# Patient Record
Sex: Female | Born: 1975 | Race: Black or African American | Hispanic: No | Marital: Single
Health system: Midwestern US, Community
[De-identification: ages and names within clinical notes are randomized; demographics above are authoritative.]

## PROBLEM LIST (undated history)

## (undated) DIAGNOSIS — D649 Anemia, unspecified: Secondary | ICD-10-CM

## (undated) DIAGNOSIS — I10 Essential (primary) hypertension: Secondary | ICD-10-CM

## (undated) DIAGNOSIS — E669 Obesity, unspecified: Secondary | ICD-10-CM

## (undated) DIAGNOSIS — R011 Cardiac murmur, unspecified: Secondary | ICD-10-CM

## (undated) DIAGNOSIS — M5440 Lumbago with sciatica, unspecified side: Secondary | ICD-10-CM

## (undated) DIAGNOSIS — Z9889 Other specified postprocedural states: Secondary | ICD-10-CM

## (undated) DIAGNOSIS — G8929 Other chronic pain: Secondary | ICD-10-CM

## (undated) DIAGNOSIS — M5441 Lumbago with sciatica, right side: Secondary | ICD-10-CM

## (undated) DIAGNOSIS — M5117 Intervertebral disc disorders with radiculopathy, lumbosacral region: Secondary | ICD-10-CM

## (undated) HISTORY — DX: Cardiac murmur, unspecified: R01.1

## (undated) HISTORY — DX: Obesity, unspecified: E66.9

---

## 1997-07-21 ENCOUNTER — Emergency Department (HOSPITAL_COMMUNITY): Admission: EM | Admit: 1997-07-21 | Discharge: 1997-07-21 | Payer: Self-pay | Admitting: Emergency Medicine

## 1997-11-23 ENCOUNTER — Emergency Department (HOSPITAL_COMMUNITY): Admission: EM | Admit: 1997-11-23 | Discharge: 1997-11-23 | Payer: Self-pay | Admitting: Emergency Medicine

## 1999-11-19 ENCOUNTER — Emergency Department (HOSPITAL_COMMUNITY): Admission: EM | Admit: 1999-11-19 | Discharge: 1999-11-19 | Payer: Self-pay | Admitting: Internal Medicine

## 2000-09-12 ENCOUNTER — Emergency Department (HOSPITAL_COMMUNITY): Admission: EM | Admit: 2000-09-12 | Discharge: 2000-09-13 | Payer: Self-pay

## 2000-09-25 ENCOUNTER — Other Ambulatory Visit: Admission: RE | Admit: 2000-09-25 | Discharge: 2000-09-25 | Payer: Self-pay | Admitting: Obstetrics

## 2000-09-25 ENCOUNTER — Encounter: Admission: RE | Admit: 2000-09-25 | Discharge: 2000-09-25 | Payer: Self-pay | Admitting: Obstetrics & Gynecology

## 2001-01-04 ENCOUNTER — Inpatient Hospital Stay (HOSPITAL_COMMUNITY): Admission: AD | Admit: 2001-01-04 | Discharge: 2001-01-04 | Payer: Self-pay | Admitting: Obstetrics & Gynecology

## 2001-01-06 ENCOUNTER — Inpatient Hospital Stay (HOSPITAL_COMMUNITY): Admission: AD | Admit: 2001-01-06 | Discharge: 2001-01-06 | Payer: Self-pay | Admitting: Obstetrics & Gynecology

## 2001-01-07 ENCOUNTER — Encounter: Payer: Self-pay | Admitting: Obstetrics & Gynecology

## 2001-01-13 ENCOUNTER — Inpatient Hospital Stay (HOSPITAL_COMMUNITY): Admission: AD | Admit: 2001-01-13 | Discharge: 2001-01-13 | Payer: Self-pay | Admitting: *Deleted

## 2001-02-09 ENCOUNTER — Inpatient Hospital Stay (HOSPITAL_COMMUNITY): Admission: AD | Admit: 2001-02-09 | Discharge: 2001-02-09 | Payer: Self-pay | Admitting: *Deleted

## 2001-03-01 ENCOUNTER — Inpatient Hospital Stay (HOSPITAL_COMMUNITY): Admission: AD | Admit: 2001-03-01 | Discharge: 2001-03-01 | Payer: Self-pay | Admitting: Obstetrics & Gynecology

## 2001-03-24 ENCOUNTER — Inpatient Hospital Stay (HOSPITAL_COMMUNITY): Admission: AD | Admit: 2001-03-24 | Discharge: 2001-03-24 | Payer: Self-pay | Admitting: Obstetrics

## 2001-05-15 ENCOUNTER — Inpatient Hospital Stay (HOSPITAL_COMMUNITY): Admission: AD | Admit: 2001-05-15 | Discharge: 2001-05-15 | Payer: Self-pay | Admitting: Obstetrics

## 2001-05-15 ENCOUNTER — Encounter: Payer: Self-pay | Admitting: Obstetrics

## 2001-06-22 ENCOUNTER — Inpatient Hospital Stay: Admission: AD | Admit: 2001-06-22 | Discharge: 2001-06-22 | Payer: Self-pay | Admitting: Obstetrics

## 2001-07-09 ENCOUNTER — Encounter: Payer: Self-pay | Admitting: Obstetrics

## 2001-07-09 ENCOUNTER — Ambulatory Visit (HOSPITAL_COMMUNITY): Admission: RE | Admit: 2001-07-09 | Discharge: 2001-07-09 | Payer: Self-pay | Admitting: Obstetrics

## 2001-08-13 ENCOUNTER — Inpatient Hospital Stay (HOSPITAL_COMMUNITY): Admission: AD | Admit: 2001-08-13 | Discharge: 2001-08-13 | Payer: Self-pay | Admitting: Obstetrics

## 2001-08-15 ENCOUNTER — Inpatient Hospital Stay (HOSPITAL_COMMUNITY): Admission: AD | Admit: 2001-08-15 | Discharge: 2001-08-19 | Payer: Self-pay | Admitting: Obstetrics

## 2001-08-16 ENCOUNTER — Encounter: Payer: Self-pay | Admitting: Obstetrics

## 2001-08-23 ENCOUNTER — Inpatient Hospital Stay (HOSPITAL_COMMUNITY): Admission: AD | Admit: 2001-08-23 | Discharge: 2001-08-23 | Payer: Self-pay | Admitting: Obstetrics

## 2001-08-24 ENCOUNTER — Inpatient Hospital Stay (HOSPITAL_COMMUNITY): Admission: AD | Admit: 2001-08-24 | Discharge: 2001-08-26 | Payer: Self-pay | Admitting: Obstetrics

## 2002-05-12 ENCOUNTER — Emergency Department (HOSPITAL_COMMUNITY): Admission: EM | Admit: 2002-05-12 | Discharge: 2002-05-12 | Payer: Self-pay | Admitting: Emergency Medicine

## 2003-04-15 ENCOUNTER — Emergency Department (HOSPITAL_COMMUNITY): Admission: EM | Admit: 2003-04-15 | Discharge: 2003-04-15 | Payer: Self-pay | Admitting: Emergency Medicine

## 2003-06-30 ENCOUNTER — Emergency Department (HOSPITAL_COMMUNITY): Admission: EM | Admit: 2003-06-30 | Discharge: 2003-06-30 | Payer: Self-pay | Admitting: Family Medicine

## 2003-07-02 ENCOUNTER — Emergency Department (HOSPITAL_COMMUNITY): Admission: EM | Admit: 2003-07-02 | Discharge: 2003-07-02 | Payer: Self-pay | Admitting: Family Medicine

## 2003-08-06 ENCOUNTER — Inpatient Hospital Stay (HOSPITAL_COMMUNITY): Admission: AD | Admit: 2003-08-06 | Discharge: 2003-08-07 | Payer: Self-pay | Admitting: Obstetrics & Gynecology

## 2003-09-04 ENCOUNTER — Inpatient Hospital Stay (HOSPITAL_COMMUNITY): Admission: AD | Admit: 2003-09-04 | Discharge: 2003-09-04 | Payer: Self-pay | Admitting: *Deleted

## 2003-10-05 ENCOUNTER — Inpatient Hospital Stay (HOSPITAL_COMMUNITY): Admission: AD | Admit: 2003-10-05 | Discharge: 2003-10-05 | Payer: Self-pay | Admitting: Obstetrics and Gynecology

## 2003-11-25 ENCOUNTER — Inpatient Hospital Stay (HOSPITAL_COMMUNITY): Admission: AD | Admit: 2003-11-25 | Discharge: 2003-11-25 | Payer: Self-pay | Admitting: Obstetrics and Gynecology

## 2003-11-27 ENCOUNTER — Inpatient Hospital Stay (HOSPITAL_COMMUNITY): Admission: AD | Admit: 2003-11-27 | Discharge: 2003-11-27 | Payer: Self-pay | Admitting: *Deleted

## 2004-01-01 ENCOUNTER — Inpatient Hospital Stay (HOSPITAL_COMMUNITY): Admission: AD | Admit: 2004-01-01 | Discharge: 2004-01-01 | Payer: Self-pay | Admitting: Obstetrics

## 2004-02-19 ENCOUNTER — Inpatient Hospital Stay (HOSPITAL_COMMUNITY): Admission: AD | Admit: 2004-02-19 | Discharge: 2004-02-19 | Payer: Self-pay | Admitting: Obstetrics

## 2004-04-25 ENCOUNTER — Inpatient Hospital Stay (HOSPITAL_COMMUNITY): Admission: AD | Admit: 2004-04-25 | Discharge: 2004-04-25 | Payer: Self-pay | Admitting: Obstetrics

## 2004-06-29 ENCOUNTER — Inpatient Hospital Stay (HOSPITAL_COMMUNITY): Admission: AD | Admit: 2004-06-29 | Discharge: 2004-06-29 | Payer: Self-pay | Admitting: Obstetrics

## 2004-07-04 ENCOUNTER — Observation Stay (HOSPITAL_COMMUNITY): Admission: AD | Admit: 2004-07-04 | Discharge: 2004-07-05 | Payer: Self-pay | Admitting: Obstetrics

## 2004-07-06 ENCOUNTER — Inpatient Hospital Stay (HOSPITAL_COMMUNITY): Admission: AD | Admit: 2004-07-06 | Discharge: 2004-07-06 | Payer: Self-pay | Admitting: Obstetrics

## 2004-07-20 ENCOUNTER — Inpatient Hospital Stay (HOSPITAL_COMMUNITY): Admission: AD | Admit: 2004-07-20 | Discharge: 2004-07-22 | Payer: Self-pay | Admitting: Obstetrics

## 2008-06-30 ENCOUNTER — Emergency Department (HOSPITAL_COMMUNITY): Admission: EM | Admit: 2008-06-30 | Discharge: 2008-06-30 | Payer: Self-pay | Admitting: Family Medicine

## 2008-07-29 ENCOUNTER — Inpatient Hospital Stay (HOSPITAL_COMMUNITY): Admission: AD | Admit: 2008-07-29 | Discharge: 2008-07-30 | Payer: Self-pay | Admitting: Obstetrics & Gynecology

## 2008-09-09 ENCOUNTER — Inpatient Hospital Stay (HOSPITAL_COMMUNITY): Admission: AD | Admit: 2008-09-09 | Discharge: 2008-09-09 | Payer: Self-pay | Admitting: Obstetrics & Gynecology

## 2008-10-01 ENCOUNTER — Inpatient Hospital Stay (HOSPITAL_COMMUNITY): Admission: AD | Admit: 2008-10-01 | Discharge: 2008-10-01 | Payer: Self-pay | Admitting: Obstetrics & Gynecology

## 2008-12-31 ENCOUNTER — Inpatient Hospital Stay (HOSPITAL_COMMUNITY): Admission: AD | Admit: 2008-12-31 | Discharge: 2008-12-31 | Payer: Self-pay | Admitting: Obstetrics and Gynecology

## 2008-12-31 ENCOUNTER — Ambulatory Visit: Payer: Self-pay | Admitting: Obstetrics and Gynecology

## 2009-01-20 ENCOUNTER — Inpatient Hospital Stay (HOSPITAL_COMMUNITY): Admission: AD | Admit: 2009-01-20 | Discharge: 2009-01-20 | Payer: Self-pay | Admitting: Obstetrics

## 2009-02-09 ENCOUNTER — Inpatient Hospital Stay (HOSPITAL_COMMUNITY): Admission: AD | Admit: 2009-02-09 | Discharge: 2009-02-10 | Payer: Self-pay | Admitting: Obstetrics & Gynecology

## 2009-03-01 ENCOUNTER — Inpatient Hospital Stay (HOSPITAL_COMMUNITY): Admission: RE | Admit: 2009-03-01 | Discharge: 2009-03-03 | Payer: Self-pay | Admitting: Obstetrics and Gynecology

## 2009-03-02 ENCOUNTER — Encounter (INDEPENDENT_AMBULATORY_CARE_PROVIDER_SITE_OTHER): Payer: Self-pay | Admitting: Obstetrics and Gynecology

## 2010-04-24 LAB — CBC
HCT: 31.7 % — ABNORMAL LOW (ref 36.0–46.0)
HCT: 38.2 % (ref 36.0–46.0)
Hemoglobin: 12.1 g/dL (ref 12.0–15.0)
Hemoglobin: 12.8 g/dL (ref 12.0–15.0)
MCHC: 33.4 g/dL (ref 30.0–36.0)
MCHC: 33.4 g/dL (ref 30.0–36.0)
MCV: 94.5 fL (ref 78.0–100.0)
MCV: 94.3 fL (ref 78.0–100.0)
Platelets: 165 10*3/uL (ref 150–400)
RBC: 3.87 MIL/uL (ref 3.87–5.11)
RDW: 14.2 % (ref 11.5–15.5)
WBC: 11.6 10*3/uL — ABNORMAL HIGH (ref 4.0–10.5)
WBC: 14.1 10*3/uL — ABNORMAL HIGH (ref 4.0–10.5)

## 2010-04-24 LAB — COMPREHENSIVE METABOLIC PANEL
ALT: 36 U/L — ABNORMAL HIGH (ref 0–35)
ALT: 35 U/L (ref 0–35)
Albumin: 2.7 g/dL — ABNORMAL LOW (ref 3.5–5.2)
Albumin: 2.1 g/dL — ABNORMAL LOW (ref 3.5–5.2)
Albumin: 2.7 g/dL — ABNORMAL LOW (ref 3.5–5.2)
Alkaline Phosphatase: 129 U/L — ABNORMAL HIGH (ref 39–117)
Alkaline Phosphatase: 174 U/L — ABNORMAL HIGH (ref 39–117)
BUN: 1 mg/dL — ABNORMAL LOW (ref 6–23)
BUN: 2 mg/dL — ABNORMAL LOW (ref 6–23)
BUN: <1 mg/dL — ABNORMAL LOW (ref 6–23)
CO2: 25 mEq/L (ref 19–32)
CO2: 26 mEq/L (ref 19–32)
CO2: 28 mEq/L (ref 19–32)
Calcium: 8.6 mg/dL (ref 8.4–10.5)
Chloride: 106 mEq/L (ref 96–112)
Chloride: 107 mEq/L (ref 96–112)
Chloride: 108 mEq/L (ref 96–112)
Creatinine, Ser: 0.82 mg/dL (ref 0.4–1.2)
Creatinine, Ser: 0.87 mg/dL (ref 0.4–1.2)
GFR calc non Af Amer: >60 mL/min (ref >60)
Glucose, Bld: 81 mg/dL (ref 70–99)
Glucose, Bld: 107 mg/dL — ABNORMAL HIGH (ref 70–99)
Glucose, Bld: 74 mg/dL (ref 70–99)
Potassium: 3 mEq/L — ABNORMAL LOW (ref 3.5–5.1)
Potassium: 3.2 mEq/L — ABNORMAL LOW (ref 3.5–5.1)
Sodium: 138 mEq/L (ref 135–145)
Sodium: 140 mEq/L (ref 135–145)
Total Bilirubin: 0.8 mg/dL (ref 0.3–1.2)

## 2010-04-24 LAB — RPR
RPR Ser Ql: NONREACTIVE
RPR Ser Ql: NONREACTIVE

## 2010-04-24 LAB — URIC ACID: Uric Acid, Serum: 6 mg/dL (ref 2.4–7.0)

## 2010-04-24 LAB — LACTATE DEHYDROGENASE
LDH: 198 U/L (ref 94–250)
LDH: 206 U/L (ref 94–250)
LDH: 237 U/L (ref 94–250)

## 2010-04-24 LAB — MAGNESIUM: Magnesium: 3.9 mg/dL — ABNORMAL HIGH (ref 1.5–2.5)

## 2010-04-24 LAB — URINALYSIS, ROUTINE W REFLEX MICROSCOPIC
Bilirubin Urine: NEGATIVE
Urobilinogen, UA: 0.2 mg/dL (ref 0.0–1.0)

## 2010-05-10 LAB — COMPREHENSIVE METABOLIC PANEL
AST: 49 U/L — ABNORMAL HIGH (ref 0–37)
CO2: 26 mEq/L (ref 19–32)
Calcium: 8.7 mg/dL (ref 8.4–10.5)
Chloride: 106 mEq/L (ref 96–112)
Creatinine, Ser: 0.61 mg/dL (ref 0.4–1.2)
GFR calc Af Amer: >60 mL/min (ref >60)
GFR calc non Af Amer: >60 mL/min (ref >60)
Glucose, Bld: 102 mg/dL — ABNORMAL HIGH (ref 70–99)
Total Bilirubin: 0.3 mg/dL (ref 0.3–1.2)

## 2010-05-10 LAB — URINALYSIS, DIPSTICK ONLY
Bilirubin Urine: NEGATIVE
Ketones, ur: NEGATIVE mg/dL
Nitrite: NEGATIVE
Specific Gravity, Urine: 1.01 (ref 1.005–1.030)
Urobilinogen, UA: 1 mg/dL (ref 0.0–1.0)
pH: 7.5 (ref 5.0–8.0)

## 2010-05-10 LAB — CBC
HCT: 37.3 % (ref 36.0–46.0)
Hemoglobin: 12.4 g/dL (ref 12.0–15.0)
MCHC: 33.3 g/dL (ref 30.0–36.0)
MCV: 95.1 fL (ref 78.0–100.0)
RBC: 3.93 MIL/uL (ref 3.87–5.11)
WBC: 11.1 10*3/uL — ABNORMAL HIGH (ref 4.0–10.5)

## 2010-05-10 LAB — LACTATE DEHYDROGENASE: LDH: 197 U/L (ref 94–250)

## 2010-05-11 LAB — URINALYSIS, ROUTINE W REFLEX MICROSCOPIC
Bilirubin Urine: NEGATIVE
Hgb urine dipstick: NEGATIVE
Nitrite: NEGATIVE
Protein, ur: NEGATIVE mg/dL
Urobilinogen, UA: 0.2 mg/dL (ref 0.0–1.0)

## 2010-05-14 LAB — COMPREHENSIVE METABOLIC PANEL
ALT: 26 U/L (ref 0–35)
AST: 27 U/L (ref 0–37)
Albumin: 3 g/dL — ABNORMAL LOW (ref 3.5–5.2)
Alkaline Phosphatase: 54 U/L (ref 39–117)
BUN: 3 mg/dL — ABNORMAL LOW (ref 6–23)
CO2: 24 mEq/L (ref 19–32)
Calcium: 8.6 mg/dL (ref 8.4–10.5)
Chloride: 105 mEq/L (ref 96–112)
Creatinine, Ser: 0.6 mg/dL (ref 0.4–1.2)
GFR calc Af Amer: >60 mL/min (ref >60)
GFR calc non Af Amer: >60 mL/min (ref >60)
Glucose, Bld: 109 mg/dL — ABNORMAL HIGH (ref 70–99)
Potassium: 3.6 mEq/L (ref 3.5–5.1)
Sodium: 136 mEq/L (ref 135–145)
Total Bilirubin: 0.4 mg/dL (ref 0.3–1.2)
Total Protein: 6.8 g/dL (ref 6.0–8.3)

## 2010-05-14 LAB — AMYLASE: Amylase: 62 U/L (ref 27–131)

## 2010-05-14 LAB — LIPASE, BLOOD: Lipase: 12 U/L (ref 11–59)

## 2010-05-16 LAB — POCT PREGNANCY, URINE: Preg Test, Ur: POSITIVE

## 2010-05-16 LAB — URINALYSIS, ROUTINE W REFLEX MICROSCOPIC
Hgb urine dipstick: NEGATIVE
Nitrite: NEGATIVE
Protein, ur: NEGATIVE mg/dL
Urobilinogen, UA: 1 mg/dL (ref 0.0–1.0)

## 2010-05-16 LAB — HCG, QUANTITATIVE, PREGNANCY: hCG, Beta Chain, Quant, S: 148425 m[IU]/mL — ABNORMAL HIGH (ref <5)

## 2010-06-24 NOTE — Discharge Summary (Signed)
   NAME:  Sara Erickson, Sara Erickson                        ACCOUNT NO.:  [id number]   MEDICAL RECORD NO.:  [id number]                   PATIENT TYPE:  INP   LOCATION:  9145                                 FACILITY:  [facility]   PHYSICIAN:  Kathreen Cosier, M.D.           DATE OF BIRTH:  1975-02-11   DATE OF ADMISSION:  08/15/2001  DATE OF DISCHARGE:  08/19/2001                                 DISCHARGE SUMMARY   HISTORY OF PRESENT ILLNESS:  The patient is a 35 year old primigravida, EDC  September 09, 2001 [redacted] weeks pregnant who came in with mild contractions and  blood pressure between 130-160/70-80, asymptomatic.  PIH laboratories were  all normal.  The fetus was reactive.  Blood pressures came down to normal.  A 24-hour urine protein was normal and she was discharged home on August 19, 2001 to see me in one week.                                               Kathreen Cosier, M.D.    BAM/MEDQ  D:  09/25/2001  T:  09/25/2001  Job:  [id number]

## 2010-12-29 IMAGING — CR DG KNEE COMPLETE 4+V*R*
4 series · 4 of 4 positions shown · non-contrast
Comparison: None

CLINICAL DATA: Patellar pain after injury

RIGHT KNEE - COMPLETE 4+ VIEW

[view not recorded (1 of 4)]
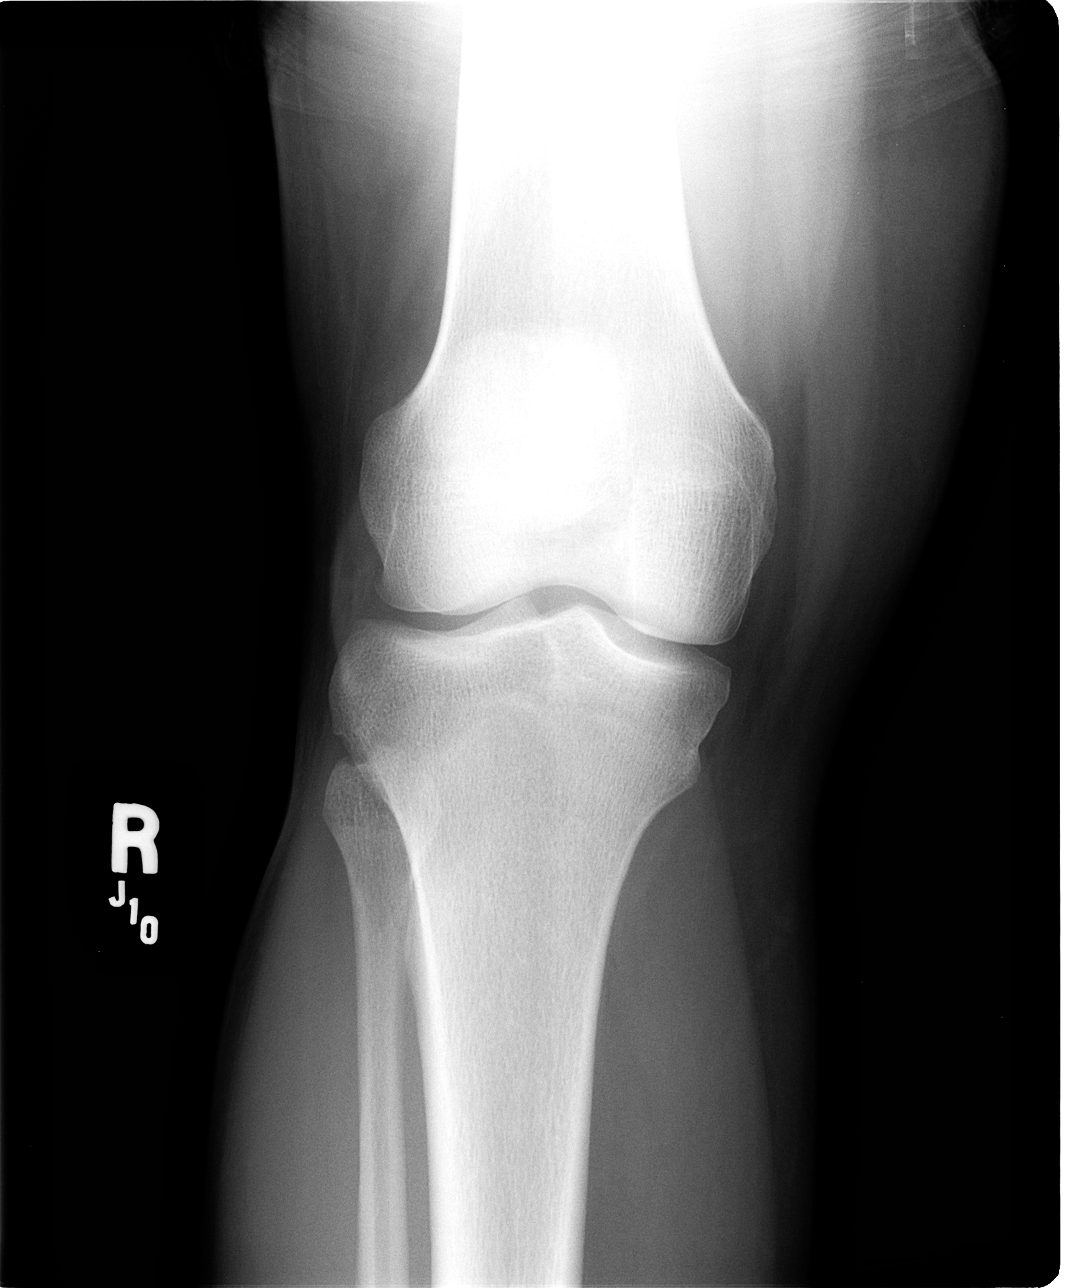

[view not recorded (2 of 4)]
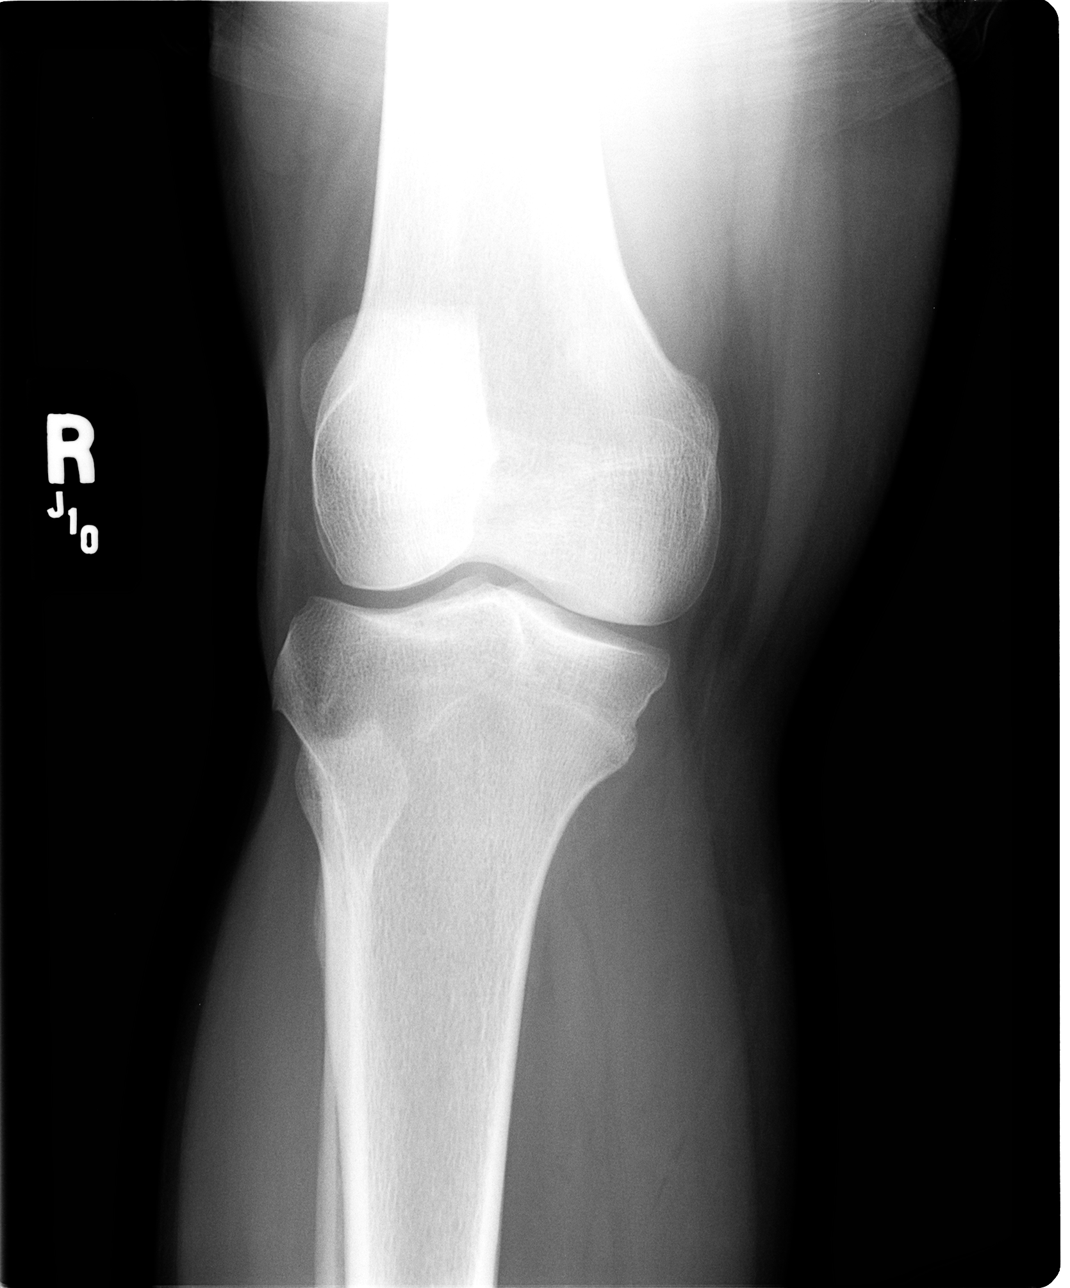

[view not recorded (3 of 4)]
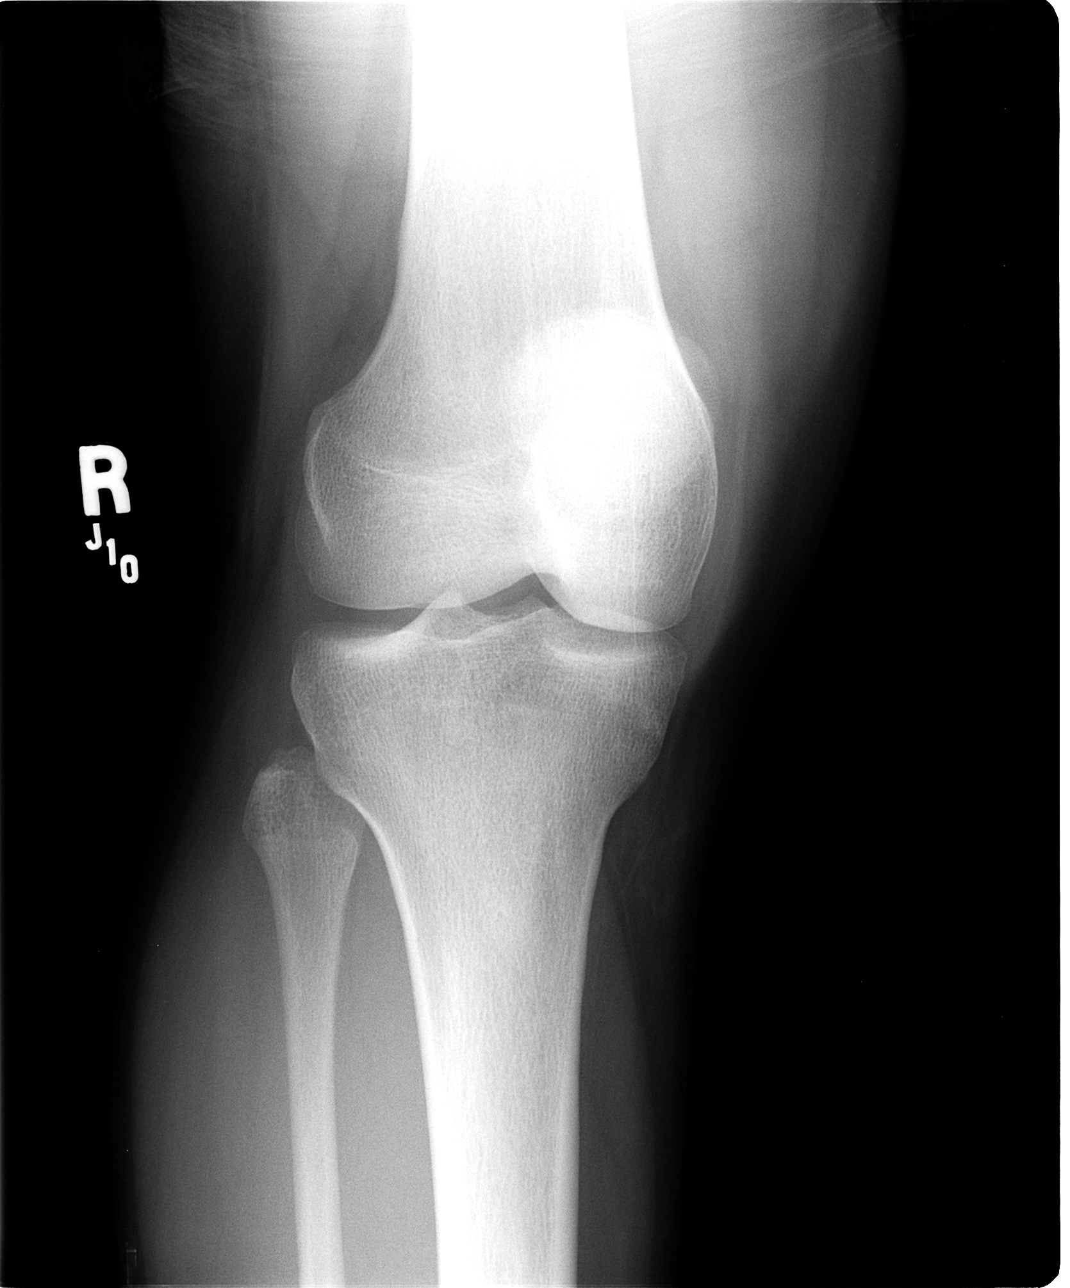

[view not recorded (4 of 4)]
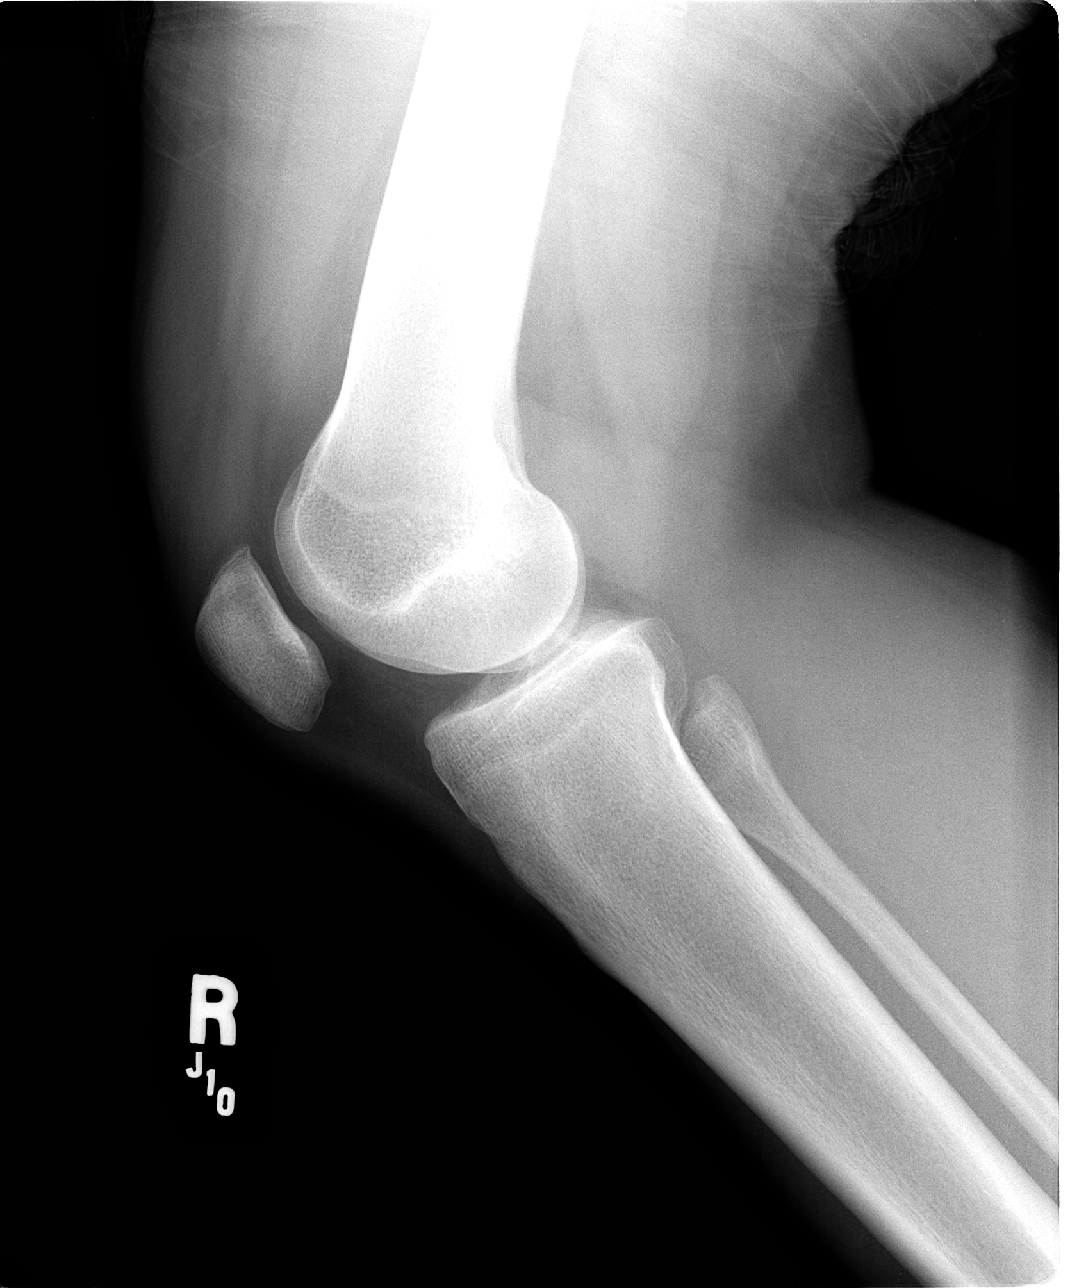

[4 of 4 positions shown; findings below may reference images not displayed]

FINDINGS: There is no evidence of bone, joint, or soft tissue
abnormality.
IMPRESSION: Normal right knee.

## 2011-01-23 ENCOUNTER — Emergency Department (INDEPENDENT_AMBULATORY_CARE_PROVIDER_SITE_OTHER)
Admission: EM | Admit: 2011-01-23 | Discharge: 2011-01-23 | Disposition: A | Discharge status: Home / Self Care | Payer: Self-pay | Source: Home / Self Care | Attending: Emergency Medicine | Admitting: Emergency Medicine

## 2011-01-23 DIAGNOSIS — K047 Periapical abscess without sinus: Secondary | ICD-10-CM

## 2011-01-23 MED ORDER — MELOXICAM 15 MG PO TABS: 15.0000 mg | ORAL_TABLET | Freq: Every day | ORAL | Status: AC

## 2011-01-23 MED ORDER — PENICILLIN V POTASSIUM 500 MG PO TABS: 500.0000 mg | ORAL_TABLET | Freq: Four times a day (QID) | ORAL | Status: AC

## 2011-01-23 MED ORDER — TRAMADOL HCL 50 MG PO TABS: 100.0000 mg | ORAL_TABLET | Freq: Three times a day (TID) | ORAL | Status: AC | PRN

## 2011-01-23 NOTE — ED Provider Notes (Signed)
History     CSN: [id number] Arrival date & time: 01/23/2011  3:52 PM   First MD Initiated Contact with Patient 01/23/11 1546      Chief Complaint  Patient presents with  . Dental Pain    Started today with dental pain.  Pain noted right bottom tooth.  Swelling of face noted on right side.  Pt. states she has facial and dental pain.      (Consider location/radiation/quality/duration/timing/severity/associated sxs/prior treatment) HPI Comments: Sara Erickson has had a three-day history of pain in the right lower first bicuspid tooth. She's had swelling inside her mouth and of her cheek. The pain radiates towards the ear. It's caused her to have a headache. She hasn't had any fever or chills and has had no difficulty with swallowing or breathing. It does hurt to chew on that side. She denies any neck swelling, coughing, wheezing, or shortness of breath. She does have an appointment with her dentist on Friday.  Patient is a 35 y.o. female presenting with tooth pain.  Dental PainThe primary symptoms include headaches. Primary symptoms do not include fever, shortness of breath or sore throat.  Additional symptoms include: ear pain. Additional symptoms do not include: facial swelling and trouble swallowing.    History reviewed. No pertinent past medical history.  History reviewed. No pertinent past surgical history.  History reviewed. No pertinent family history.  History  Substance Use Topics  . Smoking status: Never Smoker   . Smokeless tobacco: Never Used  . Alcohol Use: No    OB History    Grav Para Term Preterm Abortions TAB SAB Ect Mult Living                  Review of Systems  Constitutional: Negative for fever and chills.  HENT: Positive for ear pain and dental problem. Negative for sore throat, facial swelling, trouble swallowing, neck pain and voice change.   Respiratory: Negative for chest tightness and shortness of breath.   Neurological: Positive for headaches.     Allergies  Review of patient's allergies indicates no known allergies.  Home Medications   Current Outpatient Rx  Name Route Sig Dispense Refill  . MELOXICAM 15 MG PO TABS Oral Take 1 tablet (15 mg total) by mouth daily. 15 tablet 0  . PENICILLIN V POTASSIUM 500 MG PO TABS Oral Take 1 tablet (500 mg total) by mouth 4 (four) times daily. 40 tablet 0  . TRAMADOL HCL 50 MG PO TABS Oral Take 2 tablets (100 mg total) by mouth every 8 (eight) hours as needed for pain. Maximum dose= 8 tablets per day 30 tablet 0    BP 161/80  Pulse 81  Temp(Src) 98.3 F (36.8 C) (Oral)  Resp 16  SpO2 100%  LMP 01/16/2011  Physical Exam  Nursing note and vitals reviewed. Constitutional: She appears well-developed and well-nourished. No distress.  HENT:  Head: Normocephalic and atraumatic. No trismus in the jaw.  Right Ear: External ear normal.  Left Ear: External ear normal.  Nose: Nose normal.  Mouth/Throat: Uvula is midline and oropharynx is clear and moist. Mucous membranes are not pale and not dry. No oral lesions. Abnormal dentition. Dental abscesses and dental caries present. No uvula swelling. No oropharyngeal exudate, posterior oropharyngeal edema or posterior oropharyngeal erythema.       The right lower first bicuspid tooth is decayed and there is swelling of the gingiva around that with pain to pressure over the tooth and gingiva. She also has some swelling  of the cheek and tenderness to palpation of the mandible. There is no swelling of the floor the mouth and the pharynx is clear. Neck is nontender without swelling or adenopathy.  Eyes: Conjunctivae and EOM are normal. Pupils are equal, round, and reactive to light.  Neck: Normal range of motion. Neck supple.  Cardiovascular: Normal rate, regular rhythm and normal heart sounds.   Pulmonary/Chest: Effort normal and breath sounds normal. No respiratory distress.  Lymphadenopathy:    She has no cervical adenopathy.  Skin: She is not  diaphoretic.    ED Course  Procedures (including critical care time)  Labs Reviewed - No data to display No results found.   1. Dental abscess       MDM  She has a dental abscess and will treat with penicillin, tramadol, and meloxicam.        Roque Lias, MD 01/23/11 1710

## 2011-01-23 NOTE — ED Notes (Signed)
Started today with dental pain.  Pain noted right bottom tooth.  Swelling of face noted on right side.  Pt. states she has facial and dental pain.

## 2012-01-26 ENCOUNTER — Encounter (HOSPITAL_COMMUNITY): Payer: Self-pay | Admitting: *Deleted

## 2012-01-26 ENCOUNTER — Emergency Department (HOSPITAL_COMMUNITY)
Admission: EM | Admit: 2012-01-26 | Discharge: 2012-01-26 | Disposition: A | Discharge status: Home / Self Care | Payer: Managed Care, Other (non HMO) | Attending: Emergency Medicine | Admitting: Emergency Medicine

## 2012-01-26 DIAGNOSIS — Z3202 Encounter for pregnancy test, result negative: Secondary | ICD-10-CM | POA: Insufficient documentation

## 2012-01-26 DIAGNOSIS — I10 Essential (primary) hypertension: Secondary | ICD-10-CM | POA: Insufficient documentation

## 2012-01-26 DIAGNOSIS — R5381 Other malaise: Secondary | ICD-10-CM | POA: Insufficient documentation

## 2012-01-26 DIAGNOSIS — N390 Urinary tract infection, site not specified: Secondary | ICD-10-CM | POA: Insufficient documentation

## 2012-01-26 DIAGNOSIS — R209 Unspecified disturbances of skin sensation: Secondary | ICD-10-CM | POA: Insufficient documentation

## 2012-01-26 HISTORY — DX: Essential (primary) hypertension: I10

## 2012-01-26 LAB — BASIC METABOLIC PANEL
BUN: 9 mg/dL (ref 6–23)
Creatinine, Ser: 0.87 mg/dL (ref 0.50–1.10)
GFR calc Af Amer: >90 mL/min (ref >90)
GFR calc non Af Amer: 86 mL/min — ABNORMAL LOW (ref >90)
Potassium: 3.8 mEq/L (ref 3.5–5.1)

## 2012-01-26 LAB — URINALYSIS, ROUTINE W REFLEX MICROSCOPIC
Bilirubin Urine: NEGATIVE
Ketones, ur: NEGATIVE mg/dL
Nitrite: POSITIVE — AB
Specific Gravity, Urine: 1.019 (ref 1.005–1.030)
Urobilinogen, UA: 0.2 mg/dL (ref 0.0–1.0)

## 2012-01-26 LAB — CBC WITH DIFFERENTIAL/PLATELET
Basophils Absolute: 0 10*3/uL (ref 0.0–0.1)
Basophils Relative: 0 % (ref 0–1)
Eosinophils Absolute: 0.2 10*3/uL (ref 0.0–0.7)
Hemoglobin: 12.4 g/dL (ref 12.0–15.0)
MCH: 29.5 pg (ref 26.0–34.0)
MCHC: 33.6 g/dL (ref 30.0–36.0)
Monocytes Relative: 3 % (ref 3–12)
Neutrophils Relative %: 89 % — ABNORMAL HIGH (ref 43–77)
RDW: 14.3 % (ref 11.5–15.5)

## 2012-01-26 MED ORDER — ACETAMINOPHEN 500 MG PO TABS
1000.0000 mg | ORAL_TABLET | Freq: Once | ORAL | Status: AC
  Administered 2012-01-26: 1000 mg via ORAL
  Filled 2012-01-26: qty 2

## 2012-01-26 MED ORDER — SULFAMETHOXAZOLE-TRIMETHOPRIM 800-160 MG PO TABS: 1.0000 | ORAL_TABLET | Freq: Two times a day (BID) | ORAL | Status: AC

## 2012-01-26 NOTE — ED Notes (Signed)
Family at bedside. 

## 2012-01-26 NOTE — ED Notes (Signed)
ZOX:[id number]<AV> Expected date:01/26/12<BR> Expected time:11:45 AM<BR> Means of arrival:Ambulance<BR> Comments:<BR> 36yoF/n/v/fever

## 2012-01-26 NOTE — ED Notes (Signed)
Pt requesting pain meds for a headache

## 2012-01-26 NOTE — ED Notes (Signed)
Pt was at [employer] for work called ems for chills and fever, sts she had a stomach bug last week

## 2012-01-26 NOTE — ED Provider Notes (Signed)
History     CSN: [id number]  Arrival date & time 01/26/12  1151   First MD Initiated Contact with Patient 01/26/12 1209      Chief Complaint  Patient presents with  . Fever    (Consider location/radiation/quality/duration/timing/severity/associated sxs/prior treatment) HPI Comments: Patient is a 36 year old female with a past medical history of hypertension who presents with an episode earlier today where she was sitting in her car and her legs felt numb and weak. She reports being unable to move her legs for about 30 minutes before the episode spontaneously resolved without intervention. Patient denies any pain or injury. She has never experienced this before. She reports having a "stomach bug" last week and associated fever and chills today. No aggravating/alleviating factors. Patient did not try anything for symptom relief.    Past Medical History  Diagnosis Date  . Hypertension     History reviewed. No pertinent past surgical history.  No family history on file.  History  Substance Use Topics  . Smoking status: Never Smoker   . Smokeless tobacco: Never Used  . Alcohol Use: No    OB History    Grav Para Term Preterm Abortions TAB SAB Ect Mult Living                  Review of Systems  Constitutional: Positive for fever and chills.  Neurological: Positive for weakness and numbness.  All other systems reviewed and are negative.    Allergies  Review of patient's allergies indicates no known allergies.  Home Medications   Current Outpatient Rx  Name  Route  Sig  Dispense  Refill  . ACETAMINOPHEN 500 MG PO TABS   Oral   Take 1,000 mg by mouth every 6 (six) hours as needed. Headache           BP 127/79  Pulse 108  Temp 99.2 F (37.3 C) (Oral)  Resp 18  SpO2 100%  Physical Exam  Nursing note and vitals reviewed. Constitutional: She is oriented to person, place, and time. She appears well-developed and well-nourished. No distress.  HENT:  Head:  Normocephalic and atraumatic.  Mouth/Throat: Oropharynx is clear and moist. No oropharyngeal exudate.  Eyes: Conjunctivae normal and EOM are normal. Pupils are equal, round, and reactive to light. No scleral icterus.  Neck: Normal range of motion. Neck supple.  Cardiovascular: Normal rate and regular rhythm.  Exam reveals no gallop and no friction rub.   No murmur heard. Pulmonary/Chest: Effort normal and breath sounds normal. She has no wheezes. She has no rales. She exhibits no tenderness.  Abdominal: Soft. She exhibits no distension. There is no tenderness. There is no rebound and no guarding.  Musculoskeletal: Normal range of motion.  Neurological: She is alert and oriented to person, place, and time. No cranial nerve deficit. Coordination normal.       Strength and sensation equal and intact bilaterally. Cerebellar testing done without difficulty. Speech is goal-oriented. Moves limbs without ataxia.   Skin: Skin is warm and dry. She is not diaphoretic.  Psychiatric: She has a normal mood and affect. Her behavior is normal.    ED Course  Procedures (including critical care time)  Labs Reviewed  CBC WITH DIFFERENTIAL - Abnormal; Notable for the following:    WBC 14.1 (*)     Neutrophils Relative 89 (*)     Neutro Abs 12.5 (*)     Lymphocytes Relative 7 (*)     All other components within normal  limits  BASIC METABOLIC PANEL - Abnormal; Notable for the following:    Glucose, Bld 105 (*)     GFR calc non Af Amer 86 (*)     All other components within normal limits  URINALYSIS, ROUTINE W REFLEX MICROSCOPIC - Abnormal; Notable for the following:    APPearance CLOUDY (*)     Hgb urine dipstick MODERATE (*)     Nitrite POSITIVE (*)     Leukocytes, UA LARGE (*)     All other components within normal limits  URINE MICROSCOPIC-ADD ON - Abnormal; Notable for the following:    Bacteria, UA MANY (*)     All other components within normal limits  POCT PREGNANCY, URINE  URINE CULTURE    No results found.   1. UTI (urinary tract infection)       MDM  1:30 PM Lab work unremarkable. Urinalysis shows UTI. Pregnancy negative. Patient will be walked and discharged with Bactrim and instructions to return with worsening or concerning symptoms.         Emilia Beck, PA-C 01/26/12 1948  Emilia Beck, PA-C 01/30/12 1226

## 2012-01-28 LAB — URINE CULTURE: Colony Count: >=100000

## 2012-01-29 NOTE — ED Notes (Signed)
+   Urine Patient treated with bactrim-sensitive to same-chart appended per protocol MD. 

## 2012-01-30 NOTE — ED Provider Notes (Signed)
Medical screening examination/treatment/procedure(s) were performed by non-physician practitioner and as supervising physician I was immediately available for consultation/collaboration. Devoria Albe, MD, Armando Gang   Ward Givens, MD 01/30/12 [phone]

## 2012-03-08 ENCOUNTER — Encounter (HOSPITAL_COMMUNITY): Payer: Self-pay

## 2012-03-08 ENCOUNTER — Emergency Department (INDEPENDENT_AMBULATORY_CARE_PROVIDER_SITE_OTHER)
Admission: EM | Admit: 2012-03-08 | Discharge: 2012-03-08 | Disposition: A | Discharge status: Home / Self Care | Payer: Managed Care, Other (non HMO) | Source: Home / Self Care | Attending: Emergency Medicine | Admitting: Emergency Medicine

## 2012-03-08 DIAGNOSIS — J209 Acute bronchitis, unspecified: Secondary | ICD-10-CM

## 2012-03-08 LAB — POCT RAPID STREP A: Streptococcus, Group A Screen (Direct): NEGATIVE

## 2012-03-08 MED ORDER — AZITHROMYCIN 250 MG PO TABS: ORAL_TABLET | ORAL | Status: DC

## 2012-03-08 MED ORDER — PREDNISONE 20 MG PO TABS: 20.0000 mg | ORAL_TABLET | Freq: Two times a day (BID) | ORAL | Status: DC

## 2012-03-08 MED ORDER — ALBUTEROL SULFATE HFA 108 (90 BASE) MCG/ACT IN AERS: 1.0000 | INHALATION_SPRAY | Freq: Four times a day (QID) | RESPIRATORY_TRACT | Status: DC | PRN

## 2012-03-08 MED ORDER — HYDROCOD POLST-CHLORPHEN POLST 10-8 MG/5ML PO LQCR: 5.0000 mL | Freq: Two times a day (BID) | ORAL | Status: DC | PRN

## 2012-03-08 NOTE — ED Notes (Signed)
Cough x 3 days; chest hurts w cough; exposed to strep ; NAD at present

## 2012-03-08 NOTE — ED Provider Notes (Signed)
Chief Complaint  Patient presents with  . Cough    History of Present Illness:   Sara Erickson  is a 37 year old female who presents tonight with a one-week history of a cough productive of clear sputum, aching in her ribs when she coughs, headache, and sore throat. She has been exposed to strep. She denies any other ill exposures. She has had no fever, chills, sweats, nasal congestion, rhinorrhea, wheezing, or GI symptoms.  Review of Systems:  Other than noted above, the patient denies any of the following symptoms. Systemic:  No fever, chills, sweats, fatigue, myalgias, headache, or anorexia. Eye:  No redness, pain or drainage. ENT:  No earache, ear congestion, nasal congestion, sneezing, rhinorrhea, sinus pressure, sinus pain, post nasal drip, or sore throat. Lungs:  No cough, sputum production, wheezing, shortness of breath, or chest pain. GI:  No abdominal pain, nausea, vomiting, or diarrhea.  PMFSH:  Past medical history, family history, social history, meds, and allergies were reviewed.  Physical Exam:   Vital signs:  BP 147/89  Pulse 81  Temp 97.4 F (36.3 C) (Oral)  Resp 14  SpO2 98%  LMP 02/21/2012 General:  Alert, in no distress. Eye:  No conjunctival injection or drainage. Lids were normal. ENT:  TMs and canals were normal, without erythema or inflammation.  Nasal mucosa was clear and uncongested, without drainage.  Mucous membranes were moist.  Pharynx was erythematous, without exudate or drainage.  There were no oral ulcerations or lesions. Neck:  Supple, no adenopathy, tenderness or mass. Lungs:  No respiratory distress.  Lungs were clear to auscultation, without wheezes, rales or rhonchi.  Breath sounds were clear and equal bilaterally.  Heart:  Regular rhythm, without gallops, murmers or rubs. Skin:  Clear, warm, and dry, without rash or lesions.  Labs:   Results for orders placed during the hospital encounter of 03/08/12  POCT RAPID STREP A ([facility] ONLY)      Component Value Range   Streptococcus, Group A Screen (Direct) NEGATIVE  NEGATIVE   Assessment:  The encounter diagnosis was Acute bronchitis.  Plan:   1.  The following meds were prescribed:   New Prescriptions   ALBUTEROL (PROVENTIL HFA;VENTOLIN HFA) 108 (90 BASE) MCG/ACT INHALER    Inhale 1-2 puffs into the lungs every 6 (six) hours as needed for wheezing.   AZITHROMYCIN (ZITHROMAX Z-PAK) 250 MG TABLET    Take as directed.   CHLORPHENIRAMINE-HYDROCODONE (TUSSIONEX) 10-8 MG/5ML LQCR    Take 5 mLs by mouth every 12 (twelve) hours as needed.   PREDNISONE (DELTASONE) 20 MG TABLET    Take 1 tablet (20 mg total) by mouth 2 (two) times daily.   2.  The patient was instructed in symptomatic care and handouts were given. 3.  The patient was told to return if becoming worse in any way, if no better in 3 or 4 days, and given some red flag symptoms that would indicate earlier return.   Reuben Likes, MD 03/08/12 [phone]

## 2012-05-22 ENCOUNTER — Encounter (HOSPITAL_COMMUNITY): Payer: Self-pay | Admitting: Emergency Medicine

## 2012-05-22 ENCOUNTER — Emergency Department (HOSPITAL_COMMUNITY)
Admission: EM | Admit: 2012-05-22 | Discharge: 2012-05-22 | Disposition: A | Discharge status: Home / Self Care | Payer: Managed Care, Other (non HMO) | Source: Home / Self Care | Attending: Family Medicine | Admitting: Family Medicine

## 2012-05-22 DIAGNOSIS — N6452 Nipple discharge: Secondary | ICD-10-CM

## 2012-05-22 DIAGNOSIS — N6459 Other signs and symptoms in breast: Secondary | ICD-10-CM

## 2012-05-22 NOTE — ED Notes (Addendum)
Pt c/o left breast discharge onset yest States it was a brownish/blood discharge throughout the day Denies: pain, inj/trauma, strenuous activity, f/v/n/d  She is alert and oriented w/no signs of acute distress.

## 2012-05-22 NOTE — ED Notes (Signed)
Per Dr. Alfonse Ras, sched. Pt appt w/[facility] for 06/05/12 at 08300 Pt verb understanding and appt given to her.

## 2012-05-22 NOTE — ED Provider Notes (Signed)
History     CSN: [id number]  Arrival date & time 05/22/12  1230   First MD Initiated Contact with Patient 05/22/12 1246      Chief Complaint  Patient presents with  . Breast Discharge    (Consider location/radiation/quality/duration/timing/severity/associated sxs/prior treatment) HPI Comments: 37 year old female with family history significant for grandmother on her mother side died of complications of breast cancer and sister had a radical mastectomy for breast cancer as per patient's report. Patient is here complaining of bloody nipple discharge from her left breast since last night. Denies pain, redness or swelling. No lumps. Patient has not taken any hormonal or other type of medications currently. Denies trauma/injury 2 her breasts. 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 Denies similar episodes in the past. Denies any other symptoms.   Past Medical History  Diagnosis Date  . Hypertension     History reviewed. No pertinent past surgical history.  No family history on file.  History  Substance Use Topics  . Smoking status: Never Smoker   . Smokeless tobacco: Never Used  . Alcohol Use: No    OB History   Grav Para Term Preterm Abortions TAB SAB Ect Mult Living                  Review of Systems  Constitutional: Negative for fever, chills and fatigue.  Respiratory: Negative for cough.   Cardiovascular: Negative for chest pain.  Genitourinary:       As per history of present illness.  Skin: Negative for rash and wound.  All other systems reviewed and are negative.    Allergies  Review of patient's allergies indicates no known allergies.  Home Medications   Current Outpatient Rx  Name  Route  Sig  Dispense  Refill  . acetaminophen (TYLENOL) 500 MG tablet   Oral   Take 1,000 mg by mouth every 6 (six) hours as needed. Headache         . albuterol (PROVENTIL HFA;VENTOLIN HFA) 108 (90 BASE) MCG/ACT inhaler   Inhalation  Inhale 1-2 puffs into the lungs every 6 (six) hours as needed for wheezing.   1 Inhaler   0     BP 147/74  Pulse 69  Temp(Src) 97.6 F (36.4 C) (Oral)  Resp 16  SpO2 100%  LMP 05/21/2012  Physical Exam  Nursing note and vitals reviewed. Constitutional: She is oriented to person, place, and time. She appears well-developed and well-nourished. No distress.  HENT:  Head: Normocephalic and atraumatic.  Neck: Normal range of motion. Neck supple. No thyromegaly present.  Cardiovascular: Normal heart sounds.   Pulmonary/Chest: Breath sounds normal.  Genitourinary: No breast swelling, tenderness, discharge or bleeding.  Breasts looks symmetric. No obvious palpable lumps, cysts or masses. No skin changes or retractions. nipples appear dry and normal bilaterally. No nipple skin brakes, crusting, discharge or drainage with breast and nipple compression. No supraclavicular, neck or axillary lymphadenopathies.   Lymphadenopathy:    She has no cervical adenopathy.  Neurological: She is alert and oriented to person, place, and time.  Skin: No rash noted. She is diaphoretic.    ED Course  Procedures (including critical care time)  Labs Reviewed - No data to display No results found.   1. Nipple discharge, bloody       MDM  Bloody nipple discharge reported by patient, not confirmed on examination. Patient with family history of breast cancer on maternal grandmother and sister. [facility]  [facility] and mammogram and an appointment was made for her prior her discharge for April 30 at 8:30 AM. Patient aware of this appointment and agreeable. Red flags that should prompt her return to medical attention discussed with patient and provided in writing.        Sharin Grave, MD 05/23/12 [phone]

## 2012-06-05 ENCOUNTER — Other Ambulatory Visit (HOSPITAL_COMMUNITY): Payer: Self-pay | Admitting: Family Medicine

## 2012-06-05 ENCOUNTER — Ambulatory Visit
Admission: RE | Admit: 2012-06-05 | Discharge: 2012-06-05 | Disposition: A | Discharge status: Home / Self Care | Payer: Managed Care, Other (non HMO) | Source: Ambulatory Visit | Attending: Family Medicine | Admitting: Family Medicine

## 2012-06-05 ENCOUNTER — Ambulatory Visit: Admission: RE | Admit: 2012-06-05 | Payer: Managed Care, Other (non HMO) | Source: Ambulatory Visit

## 2012-06-05 DIAGNOSIS — N6452 Nipple discharge: Secondary | ICD-10-CM

## 2012-06-19 ENCOUNTER — Ambulatory Visit
Admission: RE | Admit: 2012-06-19 | Discharge: 2012-06-19 | Disposition: A | Discharge status: Home / Self Care | Payer: Managed Care, Other (non HMO) | Source: Ambulatory Visit | Attending: Family Medicine | Admitting: Family Medicine

## 2012-06-19 DIAGNOSIS — N6452 Nipple discharge: Secondary | ICD-10-CM

## 2012-12-28 ENCOUNTER — Encounter (HOSPITAL_COMMUNITY): Payer: Self-pay | Admitting: Emergency Medicine

## 2012-12-28 ENCOUNTER — Emergency Department (INDEPENDENT_AMBULATORY_CARE_PROVIDER_SITE_OTHER)
Admission: EM | Admit: 2012-12-28 | Discharge: 2012-12-28 | Disposition: A | Discharge status: Home / Self Care | Payer: Managed Care, Other (non HMO) | Source: Home / Self Care | Attending: Family Medicine | Admitting: Family Medicine

## 2012-12-28 DIAGNOSIS — S39012A Strain of muscle, fascia and tendon of lower back, initial encounter: Secondary | ICD-10-CM

## 2012-12-28 DIAGNOSIS — S335XXA Sprain of ligaments of lumbar spine, initial encounter: Secondary | ICD-10-CM

## 2012-12-28 LAB — POCT URINALYSIS DIP (DEVICE)
Hgb urine dipstick: NEGATIVE
Protein, ur: NEGATIVE mg/dL
Specific Gravity, Urine: >=1.03 (ref 1.005–1.030)
Urobilinogen, UA: 0.2 mg/dL (ref 0.0–1.0)
pH: 6 (ref 5.0–8.0)

## 2012-12-28 MED ORDER — KETOROLAC TROMETHAMINE 30 MG/ML IJ SOLN
30.0000 mg | Freq: Once | INTRAMUSCULAR | Status: AC
  Administered 2012-12-28: 30 mg via INTRAMUSCULAR

## 2012-12-28 MED ORDER — METAXALONE 800 MG PO TABS: 800.0000 mg | ORAL_TABLET | Freq: Three times a day (TID) | ORAL | Status: AC

## 2012-12-28 MED ORDER — IBUPROFEN 800 MG PO TABS: 800.0000 mg | ORAL_TABLET | Freq: Three times a day (TID) | ORAL | Status: DC

## 2012-12-28 MED ORDER — KETOROLAC TROMETHAMINE 30 MG/ML IJ SOLN
INTRAMUSCULAR | Status: AC
  Filled 2012-12-28: qty 1

## 2012-12-28 NOTE — ED Notes (Signed)
37 yr old is here today with complaints of lower back pain x today. She states she was at work about to clock in when the back pain started.  Denies: falls, injuries, heavy lifting

## 2012-12-28 NOTE — ED Provider Notes (Signed)
CSN: [id number]     Arrival date & time 12/28/12  1508 History   First MD Initiated Contact with Patient 12/28/12 1603     Chief Complaint  Patient presents with  . Back Pain    Lower   (Consider location/radiation/quality/duration/timing/severity/associated sxs/prior Treatment) HPI Comments: Patient presents to UC to report abrupt onset of lower back pain at 11:30am today when she turned while laughing to clock in at her job. States she was given "two pain pills" by her manager at work but these have only provided her minimal relief. Denies changes in strength or sensation in lower extremities. Denies hematuria or flank pain. Denies falling. States symptoms are exacerbated by sitting and relieved (somewhat) by standing. Denies previous surgery or injury.  The history is provided by the patient.    Past Medical History  Diagnosis Date  . Hypertension    History reviewed. No pertinent past surgical history. No family history on file. History  Substance Use Topics  . Smoking status: Never Smoker   . Smokeless tobacco: Never Used  . Alcohol Use: No   OB History   Grav Para Term Preterm Abortions TAB SAB Ect Mult Living                 Review of Systems  Constitutional: Negative.   HENT: Negative.   Respiratory: Negative.   Cardiovascular: Negative.   Gastrointestinal: Negative.   Genitourinary: Negative.   Musculoskeletal: Positive for back pain.  Skin: Negative.   Neurological: Negative.   Psychiatric/Behavioral: Negative.     Allergies  Review of patient's allergies indicates no known allergies.  Home Medications   Current Outpatient Rx  Name  Route  Sig  Dispense  Refill  . acetaminophen (TYLENOL) 500 MG tablet   Oral   Take 1,000 mg by mouth every 6 (six) hours as needed. Headache         . albuterol (PROVENTIL HFA;VENTOLIN HFA) 108 (90 BASE) MCG/ACT inhaler   Inhalation   Inhale 1-2 puffs into the lungs every 6 (six) hours as needed for wheezing.   1  Inhaler   0   . ibuprofen (ADVIL,MOTRIN) 800 MG tablet   Oral   Take 1 tablet (800 mg total) by mouth 3 (three) times daily.   21 tablet   0   . metaxalone (SKELAXIN) 800 MG tablet   Oral   Take 1 tablet (800 mg total) by mouth 3 (three) times daily.   21 tablet   0    BP 145/81  Pulse 67  Temp(Src) 97 F (36.1 C) (Oral)  Resp 17  SpO2 98%  LMP 11/26/2012 Physical Exam  Nursing note and vitals reviewed. Constitutional: She is oriented to person, place, and time. She appears well-developed and well-nourished. No distress.  Appears uncomfortable  HENT:  Head: Normocephalic and atraumatic.  Eyes: Conjunctivae are normal.  Neck: Normal range of motion. Neck supple.  Cardiovascular: Normal rate, regular rhythm and normal heart sounds.   Pulmonary/Chest: Effort normal and breath sounds normal.  Abdominal: Soft. Bowel sounds are normal. She exhibits no distension. There is no tenderness.  Musculoskeletal:       Lumbar back: She exhibits tenderness and pain. She exhibits no swelling, no deformity and no spasm.       Back:  Neurological: She is alert and oriented to person, place, and time. She has normal strength and normal reflexes. Coordination and gait normal.  +ambulatory without assistance. Great toe dorsi and plantar flexion intact B/L  Skin:  Skin is warm and dry. No pallor.  Psychiatric: She has a normal mood and affect. Her behavior is normal.    ED Course  Procedures (including critical care time) Labs Review Labs Reviewed  POCT URINALYSIS DIP (DEVICE) - Abnormal; Notable for the following:    Leukocytes, UA TRACE (*)    All other components within normal limits  POCT PREGNANCY, URINE   Imaging Review No results found.  EKG Interpretation    Date/Time:    Ventricular Rate:    PR Interval:    QRS Duration:   QT Interval:    QTC Calculation:   R Axis:     Text Interpretation:              MDM   1. Lumbar strain, initial encounter    UPT  negative. UA unremarkable. Likely lumbar strain. No neurological deficits on exam.     Ardis Rowan, PA 12/28/12 1717

## 2012-12-30 NOTE — ED Provider Notes (Signed)
Medical screening examination/treatment/procedure(s) were performed by a resident physician or non-physician practitioner and as the supervising physician I was immediately available for consultation/collaboration.  Storm Dulski, MD   Boleslaw Borghi S Charlena Haub, MD 12/30/12 0755 

## 2013-01-29 ENCOUNTER — Emergency Department (INDEPENDENT_AMBULATORY_CARE_PROVIDER_SITE_OTHER)
Admission: EM | Admit: 2013-01-29 | Discharge: 2013-01-29 | Disposition: A | Discharge status: Home / Self Care | Payer: Managed Care, Other (non HMO) | Source: Home / Self Care | Attending: Family Medicine | Admitting: Family Medicine

## 2013-01-29 ENCOUNTER — Encounter (HOSPITAL_COMMUNITY): Payer: Self-pay | Admitting: Emergency Medicine

## 2013-01-29 DIAGNOSIS — J069 Acute upper respiratory infection, unspecified: Secondary | ICD-10-CM

## 2013-01-29 MED ORDER — ALBUTEROL SULFATE HFA 108 (90 BASE) MCG/ACT IN AERS: 1.0000 | INHALATION_SPRAY | Freq: Four times a day (QID) | RESPIRATORY_TRACT | Status: DC | PRN

## 2013-01-29 NOTE — ED Provider Notes (Signed)
CSN: [id number]     Arrival date & time 01/29/13  4098 History   First MD Initiated Contact with Patient 01/29/13 [phone]     Chief Complaint  Patient presents with  . URI   (Consider location/radiation/quality/duration/timing/severity/associated sxs/prior Treatment) HPI Comments: 37 year old female with a cough for several days. She also complains of left earache, nasal congestion, PND or malaise.  Patient is a 37 y.o. female presenting with URI.  URI Presenting symptoms: congestion, cough and rhinorrhea   Presenting symptoms: no fatigue and no fever   Associated symptoms: no neck pain and no wheezing     Past Medical History  Diagnosis Date  . Hypertension    History reviewed. No pertinent past surgical history. No family history on file. History  Substance Use Topics  . Smoking status: Never Smoker   . Smokeless tobacco: Never Used  . Alcohol Use: No   OB History   Grav Para Term Preterm Abortions TAB SAB Ect Mult Living                 Review of Systems  Constitutional: Positive for activity change. Negative for fever, chills, appetite change and fatigue.  HENT: Positive for congestion, postnasal drip and rhinorrhea. Negative for facial swelling.   Eyes: Negative.   Respiratory: Positive for cough. Negative for shortness of breath and wheezing.   Cardiovascular: Negative.   Gastrointestinal: Negative.   Genitourinary: Negative.   Musculoskeletal: Negative for neck pain and neck stiffness.  Skin: Negative for pallor and rash.  Neurological: Negative.   All other systems reviewed and are negative.    Allergies  Review of patient's allergies indicates no known allergies.  Home Medications   Current Outpatient Rx  Name  Route  Sig  Dispense  Refill  . acetaminophen (TYLENOL) 500 MG tablet   Oral   Take 1,000 mg by mouth every 6 (six) hours as needed. Headache         . albuterol (PROVENTIL HFA;VENTOLIN HFA) 108 (90 BASE) MCG/ACT inhaler   Inhalation  Inhale 1-2 puffs into the lungs every 6 (six) hours as needed for wheezing.   1 Inhaler   0   . albuterol (PROVENTIL HFA;VENTOLIN HFA) 108 (90 BASE) MCG/ACT inhaler   Inhalation   Inhale 1-2 puffs into the lungs every 6 (six) hours as needed for wheezing or shortness of breath.   1 Inhaler   0   . ibuprofen (ADVIL,MOTRIN) 800 MG tablet   Oral   Take 1 tablet (800 mg total) by mouth 3 (three) times daily.   21 tablet   0    BP 137/87  Pulse 76  Temp(Src) 98.2 F (36.8 C) (Oral)  Resp 18  SpO2 97%  LMP 01/10/2013 Physical Exam  Nursing note and vitals reviewed. Constitutional: She is oriented to person, place, and time. She appears well-developed and well-nourished. No distress.  HENT:  Mouth/Throat: No oropharyngeal exudate.  TM retracted, right TM normal Oropharynx with PND and minor erythema.  Eyes: Conjunctivae are normal.  Neck: Normal range of motion. Neck supple.  Cardiovascular: Normal rate, regular rhythm and normal heart sounds.   Pulmonary/Chest: Effort normal and breath sounds normal. No respiratory distress. She has no wheezes. She has no rales.  Abdominal: Soft. There is no tenderness.  Musculoskeletal: Normal range of motion. She exhibits no edema.  Lymphadenopathy:    She has no cervical adenopathy.  Neurological: She is alert and oriented to person, place, and time.  Skin: Skin is warm and dry.  No rash noted.  Psychiatric: She has a normal mood and affect.    ED Course  Procedures (including critical care time) Labs Review Labs Reviewed - No data to display Imaging Review No results found.    MDM   1. URI (upper respiratory infection)      Robitussin DM Alka seltzer cold plus Fluids Rest Chicken soup. RF albuterol,   (chest clear today)  Hayden Rasmussen, NP 01/29/13 1610  Hayden Rasmussen, NP 01/29/13 [phone]

## 2013-01-29 NOTE — ED Notes (Signed)
Pt c/o cold sxs onset 3 days w/productive cough and bilateral ear pain She denies: f/v/n/d, SOB, wheezing.... Ran out of her albuterol pump Alert w/no signs of acute distress.

## 2013-01-29 NOTE — ED Provider Notes (Signed)
Medical screening examination/treatment/procedure(s) were performed by resident physician or non-physician practitioner and as supervising physician I was immediately available for consultation/collaboration.   Barkley Bruns MD.   Linna Hoff, MD 01/29/13 [phone]

## 2014-08-20 ENCOUNTER — Emergency Department (HOSPITAL_COMMUNITY)
Admission: EM | Admit: 2014-08-20 | Discharge: 2014-08-20 | Disposition: A | Discharge status: Home / Self Care | Payer: Managed Care, Other (non HMO) | Attending: Emergency Medicine | Admitting: Emergency Medicine

## 2014-08-20 ENCOUNTER — Encounter (HOSPITAL_COMMUNITY): Payer: Self-pay | Admitting: Adult Health

## 2014-08-20 DIAGNOSIS — R51 Headache: Secondary | ICD-10-CM | POA: Diagnosis not present

## 2014-08-20 DIAGNOSIS — I10 Essential (primary) hypertension: Secondary | ICD-10-CM | POA: Insufficient documentation

## 2014-08-20 DIAGNOSIS — Z3202 Encounter for pregnancy test, result negative: Secondary | ICD-10-CM | POA: Diagnosis not present

## 2014-08-20 DIAGNOSIS — H539 Unspecified visual disturbance: Secondary | ICD-10-CM | POA: Insufficient documentation

## 2014-08-20 DIAGNOSIS — R519 Headache, unspecified: Secondary | ICD-10-CM

## 2014-08-20 LAB — BASIC METABOLIC PANEL
Anion gap: 5 (ref 5–15)
BUN: 11 mg/dL (ref 6–20)
CHLORIDE: 105 mmol/L (ref 101–111)
CO2: 27 mmol/L (ref 22–32)
CREATININE: 1.01 mg/dL — AB (ref 0.44–1.00)
Calcium: 7.9 mg/dL — ABNORMAL LOW (ref 8.9–10.3)
Glucose, Bld: 152 mg/dL — ABNORMAL HIGH (ref 65–99)
POTASSIUM: 3.6 mmol/L (ref 3.5–5.1)
SODIUM: 137 mmol/L (ref 135–145)

## 2014-08-20 LAB — CBC WITH DIFFERENTIAL/PLATELET
BASOS ABS: 0 10*3/uL (ref 0.0–0.1)
Basophils Relative: 0 % (ref 0–1)
EOS ABS: 0.2 10*3/uL (ref 0.0–0.7)
Eosinophils Relative: 2 % (ref 0–5)
HCT: 36.3 % (ref 36.0–46.0)
HEMOGLOBIN: 12.3 g/dL (ref 12.0–15.0)
LYMPHS ABS: 2.8 10*3/uL (ref 0.7–4.0)
LYMPHS PCT: 27 % (ref 12–46)
MCH: 30.4 pg (ref 26.0–34.0)
MCHC: 33.9 g/dL (ref 30.0–36.0)
MCV: 89.6 fL (ref 78.0–100.0)
Monocytes Absolute: 0.8 10*3/uL (ref 0.1–1.0)
Monocytes Relative: 8 % (ref 3–12)
NEUTROS ABS: 6.4 10*3/uL (ref 1.7–7.7)
NEUTROS PCT: 63 % (ref 43–77)
PLATELETS: 230 10*3/uL (ref 150–400)
RBC: 4.05 MIL/uL (ref 3.87–5.11)
RDW: 13.7 % (ref 11.5–15.5)
WBC: 10.2 10*3/uL (ref 4.0–10.5)

## 2014-08-20 LAB — POC URINE PREG, ED: Preg Test, Ur: NEGATIVE

## 2014-08-20 MED ORDER — SODIUM CHLORIDE 0.9 % IV BOLUS (SEPSIS)
500.0000 mL | Freq: Once | INTRAVENOUS | Status: AC
  Administered 2014-08-20: 500 mL via INTRAVENOUS

## 2014-08-20 MED ORDER — METOCLOPRAMIDE HCL 5 MG/ML IJ SOLN
10.0000 mg | Freq: Once | INTRAMUSCULAR | Status: DC
  Filled 2014-08-20: qty 2

## 2014-08-20 MED ORDER — DIPHENHYDRAMINE HCL 50 MG/ML IJ SOLN
25.0000 mg | Freq: Once | INTRAMUSCULAR | Status: DC
  Filled 2014-08-20: qty 1

## 2014-08-20 NOTE — ED Provider Notes (Signed)
CSN: [id number]     Arrival date & time 08/20/14  1800 History   First MD Initiated Contact with Patient 08/20/14 1811     Chief Complaint  Patient presents with  . Headache   Sara Erickson is a 39 y.o. female with a history of hypertension who presents to the emergency department complaining of a headache with onset this afternoon. The patient reports she was at work when she started having floaters in her vision and developed a frontal headache over the next 15 minutes. She reports having a 7/10 frontal headache that improved greatly after EMS gave her fentanyl. Currently she complains of a 2/10 headache. EMS reports her blood pressure was 200/120 initially. She reports having frequent headaches over the past several months. Patient reports she had hypertension during her pregnancy with this resolved at her 6 week checkup. She reports she is not being followed by primary care provider currently. She denies taking any high blood pressure medications. The patient denies fevers, chills, double vision, lightheadedness, dizziness, chest pain, shortness of breath, neck pain, neck stiffness, numbness, tingling, weakness, loss of consciousness, abdominal pain, nausea, vomiting and ear pain or sore throat.  (Consider location/radiation/quality/duration/timing/severity/associated sxs/prior Treatment) HPI  Past Medical History  Diagnosis Date  . Hypertension    History reviewed. No pertinent past surgical history. History reviewed. No pertinent family history. History  Substance Use Topics  . Smoking status: Never Smoker   . Smokeless tobacco: Never Used  . Alcohol Use: No   OB History    No data available     Review of Systems  Constitutional: Negative for fever and chills.  HENT: Negative for congestion and sore throat.   Eyes: Positive for visual disturbance. Negative for photophobia and pain.  Respiratory: Negative for cough and shortness of breath.   Cardiovascular: Negative for chest  pain and palpitations.  Gastrointestinal: Negative for nausea, vomiting, abdominal pain and diarrhea.  Genitourinary: Negative for dysuria.  Musculoskeletal: Negative for back pain, neck pain and neck stiffness.  Skin: Negative for rash.  Neurological: Positive for headaches. Negative for dizziness, syncope, weakness, light-headedness and numbness.      Allergies  Review of patient's allergies indicates no known allergies.  Home Medications   Prior to Admission medications   Medication Sig Start Date End Date Taking? Authorizing Provider  ibuprofen (ADVIL,MOTRIN) 200 MG tablet Take 200 mg by mouth every 6 (six) hours as needed for headache, mild pain or moderate pain.   Yes Historical Provider, MD  OVER THE COUNTER MEDICATION Take 2 tablets by mouth daily.   Yes Historical Provider, MD  albuterol (PROVENTIL HFA;VENTOLIN HFA) 108 (90 BASE) MCG/ACT inhaler Inhale 1-2 puffs into the lungs every 6 (six) hours as needed for wheezing. Patient not taking: Reported on 08/20/2014 03/08/12   Reuben Likesavid C Keller, MD  albuterol (PROVENTIL HFA;VENTOLIN HFA) 108 (90 BASE) MCG/ACT inhaler Inhale 1-2 puffs into the lungs every 6 (six) hours as needed for wheezing or shortness of breath. Patient not taking: Reported on 08/20/2014 01/29/13   Hayden Rasmussenavid Mabe, NP   BP 149/63 mmHg  Pulse 79  Temp(Src) 97.5 F (36.4 C) (Oral)  Resp 18  SpO2 100%  LMP 08/07/2014 (Exact Date) Physical Exam  Constitutional: She is oriented to person, place, and time. She appears well-developed and well-nourished. No distress.  Nontoxic appearing.  HENT:  Head: Normocephalic and atraumatic.  Right Ear: External ear normal.  Left Ear: External ear normal.  Mouth/Throat: Oropharynx is clear and moist. No oropharyngeal exudate.  Eyes: Conjunctivae and EOM are normal. Pupils are equal, round, and reactive to light. Right eye exhibits no discharge. Left eye exhibits no discharge.  Neck: Normal range of motion. Neck supple. No JVD  present. No tracheal deviation present.  Cardiovascular: Normal rate, regular rhythm, normal heart sounds and intact distal pulses.  Exam reveals no gallop and no friction rub.   No murmur heard. Bilateral radial, posterior tibialis and dorsalis pedis pulses are intact.    Pulmonary/Chest: Effort normal and breath sounds normal. No respiratory distress. She has no wheezes. She has no rales.  Abdominal: Soft. She exhibits no distension. There is no tenderness. There is no guarding.  Musculoskeletal: She exhibits no edema or tenderness.  No lower extremity edema or tenderness.Patient is spontaneously moving all extremities in a coordinated fashion exhibiting good strength.   Lymphadenopathy:    She has no cervical adenopathy.  Neurological: She is alert and oriented to person, place, and time. No cranial nerve deficit. Coordination normal.  The patient is alert and oriented 3. Cranial nerves are intact. No pronator drift. Finger to nose intact bilaterally. Good and equal grip strength bilaterally. Sensation is intact or bilateral upper and lower extremities.   Skin: Skin is warm and dry. No rash noted. She is not diaphoretic. No erythema. No pallor.  Psychiatric: She has a normal mood and affect. Her behavior is normal.  Nursing note and vitals reviewed.   ED Course  Procedures (including critical care time) Labs Review Labs Reviewed  BASIC METABOLIC PANEL - Abnormal; Notable for the following:    Glucose, Bld 152 (*)    Creatinine, Ser 1.01 (*)    Calcium 7.9 (*)    All other components within normal limits  CBC WITH DIFFERENTIAL/PLATELET  POC URINE PREG, ED    Imaging Review No results found.   EKG Interpretation None      Filed Vitals:   08/20/14 1818 08/20/14 2046 08/20/14 2109  BP: 150/93 111/79 149/63  Pulse: 75 85 79  Temp: 97.9 F (36.6 C) 98 F (36.7 C) 97.5 F (36.4 C)  TempSrc: Oral Oral Oral  Resp: SpO2: 99% 100% 100%     MDM   Meds given in  ED:  Medications  metoCLOPramide (REGLAN) injection 10 mg (0 mg Intravenous Hold 08/20/14 1843)  diphenhydrAMINE (BENADRYL) injection 25 mg (0 mg Intravenous Hold 08/20/14 1843)  sodium chloride 0.9 % bolus 500 mL (0 mLs Intravenous Stopped 08/20/14 2110)    New Prescriptions   No medications on file    Final diagnoses:  Bad headache   Pt HA treated and improved while in ED.  Presentation is like pts typical HA and she reports gradual onset over 15 minutes to a 7/10 HA. It's non concerning for [facility], ICH, Meningitis, or temporal arteritis. Pt is afebrile with no focal neuro deficits, nuchal rigidity, or change in vision. On reevaluation the patient's headache has completely resolved and her blood pressure has improved to 149/63. Patient's initial blood pressure was only 150/93. I advised her to follow-up with her PCP to recheck her blood pressure and discuss prophylactic medication.  Pt verbalizes understanding and is agreeable with plan to discharge. I advised the patient to follow-up with their primary care provider this week. I advised the patient to return to the emergency department with new or worsening symptoms or new concerns. The patient verbalized understanding and agreement with plan.     Everlene Farrier, PA-C 08/21/14 4098  Azalia Bilis, MD 08/25/14 [phone]

## 2014-08-20 NOTE — Discharge Instructions (Signed)
Please follow up with your primary care provider to have your blood pressure rechecked. General Headache Without Cause A headache is pain or discomfort felt around the head or neck area. The specific cause of a headache may not be found. There are many causes and types of headaches. A few common ones are:  Tension headaches.  Migraine headaches.  Cluster headaches.  Chronic daily headaches. HOME CARE INSTRUCTIONS   Keep all follow-up appointments with your caregiver or any specialist referral.  Only take over-the-counter or prescription medicines for pain or discomfort as directed by your caregiver.  Lie down in a dark, quiet room when you have a headache.  Keep a headache journal to find out what may trigger your migraine headaches. For example, write down:  What you eat and drink.  How much sleep you get.  Any change to your diet or medicines.  Try massage or other relaxation techniques.  Put ice packs or heat on the head and neck. Use these 3 to 4 times per day for 15 to 20 minutes each time, or as needed.  Limit stress.  Sit up straight, and do not tense your muscles.  Quit smoking if you smoke.  Limit alcohol use.  Decrease the amount of caffeine you drink, or stop drinking caffeine.  Eat and sleep on a regular schedule.  Get 7 to 9 hours of sleep, or as recommended by your caregiver.  Keep lights dim if bright lights bother you and make your headaches worse. SEEK MEDICAL CARE IF:   You have problems with the medicines you were prescribed.  Your medicines are not working.  You have a change from the usual headache.  You have nausea or vomiting. SEEK IMMEDIATE MEDICAL CARE IF:   Your headache becomes severe.  You have a fever.  You have a stiff neck.  You have loss of vision.  You have muscular weakness or loss of muscle control.  You start losing your balance or have trouble walking.  You feel faint or pass out.  You have severe symptoms  that are different from your first symptoms. MAKE SURE YOU:   Understand these instructions.  Will watch your condition.  Will get help right away if you are not doing well or get worse. Document Released: 01/23/2005 Document Revised: 04/17/2011 Document Reviewed: 02/08/2011 [facility] Patient Information 2015 [employer], [place]. This information is not intended to replace advice given to you by your health care provider. Make sure you discuss any questions you have with your health care provider.

## 2014-08-20 NOTE — ED Notes (Signed)
Presents with sudden onset of headache and blurred vision began at work about an hour ago-Upon EMS arrival BP 200/120 with pain 7/10, given 100mcg of fentanyl by EMS pain now 3/10 and BP 150/93. PT states, headache have occurred before when not wearing glasses, but never had the blurry vision. Alert, oriented and MAE x4. Denies nu,bnmess and tingling.

## 2014-08-20 NOTE — ED Notes (Signed)
Bed: XL24WA22 Expected date:  Expected time:  Means of arrival:  Comments: EMS- headache, hypertension

## 2014-09-02 ENCOUNTER — Telehealth: Payer: Self-pay | Admitting: Cardiovascular Disease

## 2014-09-02 NOTE — Telephone Encounter (Signed)
Received records from [facility] for appointment on 09/08/14 with Dr Duke Salvia.  Records given to [facility] (medical records) for Dr Leonides Sake schedule on 09/08/14. lp

## 2014-09-02 NOTE — Telephone Encounter (Signed)
Closed encounter °

## 2014-09-08 ENCOUNTER — Encounter: Payer: Managed Care, Other (non HMO) | Admitting: Cardiovascular Disease

## 2014-09-08 NOTE — Progress Notes (Signed)
Patient ID: Sara Erickson, female   DOB: [birth year], 39 y.o.   MRN: [id number]

## 2014-09-16 NOTE — Progress Notes (Signed)
Cardiology Office Note   Date:  09/17/2014   ID:  Sara Erickson, DOB 1975/11/06, MRN [id number]  PCP:  Andria Meuse, PA-C  Cardiologist:   Madilyn Hook, MD   Chief Complaint  Patient presents with  . New Evaluation    no chest pain, no shortness of breath, no edema, no pain in legs, no cramping in legs, no lightheadedness, no dizziness  . Heart Murmur    wants to have it evaluated       History of Present Illness: Sara Erickson is a 39 y.o. female who presents for evaluation of a murmur.  Sara Erickson was evaluated in the ED 7/14 with HA.  She was found to have HTN with a BP of 200/120.  She was referred to her PCP at [facility], who started her on HCTZ-Lisinopril.  She also noted that Sara Erickson had a systolic murmur.  She denies any chest pain, chest pressure, SOB, lightheadedness, dizziness, palpitations, lower extremity edema, orthopnea, or PND. She notes that both her mother and her sister also have murmurs. However Is ever told her that she had a murmur.  Sara Erickson is hopeful that she'll be able to come off her blood pressure medications. She has started walking twice per week. She walks in 6 to 8:30 at the group of friends and walks for approximately 1 mile. She also has started to improve her diet. She's been boiling and baking her chicken and eating salmon. She is also tried to decrease her pork intake. She is increased her vegetable intake and is now drinking water instead of sodas.  Past Medical History  Diagnosis Date  . Hypertension   . Murmur 09/17/2014  . Obesity (BMI 30.0-34.9) 09/17/2014    No past surgical history on file.   Current Outpatient Prescriptions  Medication Sig Dispense Refill  . ibuprofen (ADVIL,MOTRIN) 200 MG tablet Take 200 mg by mouth every 6 (six) hours as needed for headache, mild pain or moderate pain.    Marland Kitchen lisinopril-hydrochlorothiazide (PRINZIDE,ZESTORETIC) 20-12.5 MG per tablet Take 1 tablet by mouth daily.     No current  facility-administered medications for this visit.    Allergies:   Review of patient's allergies indicates no known allergies.    Social History:  The patient  reports that she has never smoked. She has never used smokeless tobacco. She reports that she does not drink alcohol or use illicit drugs.   Family History:  The patient's  family history includes Cancer in her sister and sister; Diabetes in her father and mother; Hypertension in her father and mother.    ROS:  Please see the history of present illness.   Otherwise, review of systems are positive for none.   All other systems are reviewed and negative.    PHYSICAL EXAM: VS:  BP 140/82 mmHg  Pulse 76  Ht 5\' 9"  (1.753 m)  Wt 96.304 kg (212 lb 5 oz)  BMI 31.34 kg/m2  LMP 08/07/2014 (Exact Date) , BMI Body mass index is 31.34 kg/(m^2). GENERAL:  Well appearing HEENT:  Pupils equal round and reactive, fundi not visualized, oral mucosa unremarkable NECK:  No jugular venous distention, waveform within normal limits, carotid upstroke brisk and symmetric, no bruits, no thyromegaly LYMPHATICS:  No cervical adenopathy LUNGS:  Clear to auscultation bilaterally HEART:  RRR.  PMI not displaced or sustained,S1 and S2 within normal limits, no S3, no S4, no clicks, no rubs, II/VI systolic murmur at R and LUSB ABD:  Flat,  positive bowel sounds normal in frequency in pitch, no bruits, no rebound, no guarding, no midline pulsatile mass, no hepatomegaly, no splenomegaly EXT:  2 plus pulses throughout, no edema, no cyanosis no clubbing SKIN:  No rashes no nodules NEURO:  Cranial nerves II through XII grossly intact, motor grossly intact throughout PSYCH:  Cognitively intact, oriented to person place and time    EKG:  EKG is ordered today. The ekg ordered today demonstrates sinus rhythm 76 bpm.    Recent Labs: 08/20/2014: BUN 11; Creatinine, Ser 1.01*; Hemoglobin 12.3; Platelets 230; Potassium 3.6; Sodium 137    Lipid Panel No results  found for: CHOL, TRIG, HDL, CHOLHDL, VLDL, LDLCALC, LDLDIRECT    Wt Readings from Last 3 Encounters:  09/17/14 96.304 kg (212 lb 5 oz)      Other studies Reviewed: Additional studies/ records that were reviewed today include: . Review of the above records demonstrates:  Please see elsewhere in the note.     ASSESSMENT AND PLAN:  # Murmur: Sara Erickson has a systolic murmur on exam.  Her history and exam are otherwise benign.  She is beginning a new exercise routine and is seeking reassurance that her heart is stable.  It is reasonable to obtain an echo to assess for structural heart disease. - TTE  # Hypertension: BP better controlled today.  Agree with current HCTZ-Lisinopril management.  # Obesity/CV Disease prevention: Sara Erickson and I discussed the need to increase her physical activity and improve her diet.  She is already taking some great steps.  I recommended that she increase her pace as she walks to try and walk one mile in 20-30 minutes rather than 2.5 hours.  She expressed understanding.   Current medicines are reviewed at length with the patient today.  The patient does not have concerns regarding medicines.  The following changes have been made:  no change  Labs/ tests ordered today include: TTE   Orders Placed This Encounter  Procedures  . EKG 12-Lead  . ECHOCARDIOGRAM COMPLETE     Disposition:   FU with Dr. Elmarie Shiley C. Duke Salvia prn    Signed, Madilyn Hook, MD  09/17/2014 4:15 PM    [facility]

## 2014-09-17 ENCOUNTER — Ambulatory Visit (INDEPENDENT_AMBULATORY_CARE_PROVIDER_SITE_OTHER): Payer: Managed Care, Other (non HMO) | Admitting: Cardiovascular Disease

## 2014-09-17 ENCOUNTER — Encounter: Payer: Self-pay | Admitting: Cardiovascular Disease

## 2014-09-17 VITALS — BP 140/82 | HR 76 | Ht 69.0 in | Wt 212.3 lb

## 2014-09-17 DIAGNOSIS — E66811 Obesity, class 1: Secondary | ICD-10-CM

## 2014-09-17 DIAGNOSIS — I10 Essential (primary) hypertension: Secondary | ICD-10-CM | POA: Diagnosis not present

## 2014-09-17 DIAGNOSIS — R011 Cardiac murmur, unspecified: Secondary | ICD-10-CM | POA: Diagnosis not present

## 2014-09-17 DIAGNOSIS — E669 Obesity, unspecified: Secondary | ICD-10-CM

## 2014-09-17 HISTORY — DX: Cardiac murmur, unspecified: R01.1

## 2014-09-17 HISTORY — DX: Obesity, unspecified: E66.9

## 2014-09-17 HISTORY — DX: Obesity, class 1: E66.811

## 2014-09-17 NOTE — Patient Instructions (Signed)
Your physician has requested that you have an echocardiogram at  [facility]---[address]. Echocardiography is a painless test that uses sound waves to create images of your heart. It provides your doctor with information about the size and shape of your heart and how well your heart's chambers and valves are working. This procedure takes approximately one hour. There are no restrictions for this procedure.    WE WILL CONTACT YOU WITH RESULTS.   Your physician recommends that you schedule a follow-up appointment ON AS NEEDED BASIS.

## 2014-09-23 ENCOUNTER — Ambulatory Visit (HOSPITAL_COMMUNITY): Payer: Managed Care, Other (non HMO) | Attending: Cardiovascular Disease

## 2014-09-23 ENCOUNTER — Other Ambulatory Visit: Payer: Self-pay

## 2014-09-23 DIAGNOSIS — R011 Cardiac murmur, unspecified: Secondary | ICD-10-CM | POA: Diagnosis not present

## 2014-09-23 DIAGNOSIS — I517 Cardiomegaly: Secondary | ICD-10-CM | POA: Diagnosis not present

## 2014-09-23 DIAGNOSIS — I10 Essential (primary) hypertension: Secondary | ICD-10-CM | POA: Insufficient documentation

## 2014-09-23 DIAGNOSIS — I371 Nonrheumatic pulmonary valve insufficiency: Secondary | ICD-10-CM | POA: Diagnosis not present

## 2014-10-13 ENCOUNTER — Ambulatory Visit: Payer: Managed Care, Other (non HMO) | Admitting: Cardiovascular Disease

## 2015-02-07 HISTORY — PX: LUMBAR DISC SURGERY: SHX700

## 2015-02-10 ENCOUNTER — Other Ambulatory Visit: Payer: Self-pay | Admitting: Physician Assistant

## 2015-02-10 ENCOUNTER — Other Ambulatory Visit (HOSPITAL_COMMUNITY)
Admission: RE | Admit: 2015-02-10 | Discharge: 2015-02-10 | Disposition: A | Discharge status: Home / Self Care | Payer: Managed Care, Other (non HMO) | Source: Ambulatory Visit | Attending: Physician Assistant | Admitting: Physician Assistant

## 2015-02-10 DIAGNOSIS — Z01419 Encounter for gynecological examination (general) (routine) without abnormal findings: Secondary | ICD-10-CM | POA: Diagnosis present

## 2015-02-10 DIAGNOSIS — Z1151 Encounter for screening for human papillomavirus (HPV): Secondary | ICD-10-CM | POA: Insufficient documentation

## 2015-02-12 LAB — CYTOLOGY - PAP

## 2015-03-30 ENCOUNTER — Other Ambulatory Visit: Payer: Self-pay | Admitting: Physician Assistant

## 2015-03-30 DIAGNOSIS — M5416 Radiculopathy, lumbar region: Secondary | ICD-10-CM

## 2015-04-07 ENCOUNTER — Ambulatory Visit
Admission: RE | Admit: 2015-04-07 | Discharge: 2015-04-07 | Disposition: A | Discharge status: Home / Self Care | Payer: Managed Care, Other (non HMO) | Source: Ambulatory Visit | Attending: Physician Assistant | Admitting: Physician Assistant

## 2015-04-07 DIAGNOSIS — M5416 Radiculopathy, lumbar region: Secondary | ICD-10-CM

## 2015-04-09 ENCOUNTER — Encounter (HOSPITAL_COMMUNITY): Payer: Self-pay | Admitting: *Deleted

## 2015-04-09 ENCOUNTER — Emergency Department (HOSPITAL_COMMUNITY)
Admission: EM | Admit: 2015-04-09 | Discharge: 2015-04-09 | Disposition: A | Discharge status: Home / Self Care | Payer: Managed Care, Other (non HMO) | Attending: Emergency Medicine | Admitting: Emergency Medicine

## 2015-04-09 DIAGNOSIS — Z79899 Other long term (current) drug therapy: Secondary | ICD-10-CM | POA: Diagnosis not present

## 2015-04-09 DIAGNOSIS — M545 Low back pain: Secondary | ICD-10-CM | POA: Insufficient documentation

## 2015-04-09 DIAGNOSIS — I10 Essential (primary) hypertension: Secondary | ICD-10-CM | POA: Insufficient documentation

## 2015-04-09 DIAGNOSIS — R011 Cardiac murmur, unspecified: Secondary | ICD-10-CM | POA: Diagnosis not present

## 2015-04-09 DIAGNOSIS — Z6834 Body mass index (BMI) 34.0-34.9, adult: Secondary | ICD-10-CM | POA: Insufficient documentation

## 2015-04-09 DIAGNOSIS — E669 Obesity, unspecified: Secondary | ICD-10-CM | POA: Insufficient documentation

## 2015-04-09 MED ORDER — KETOROLAC TROMETHAMINE 30 MG/ML IJ SOLN
30.0000 mg | Freq: Once | INTRAMUSCULAR | Status: AC
Start: 2015-04-09 — End: 2015-04-09
  Administered 2015-04-09: 30 mg via INTRAMUSCULAR
  Filled 2015-04-09: qty 1

## 2015-04-09 MED ORDER — MELOXICAM 15 MG PO TABS: 15.0000 mg | ORAL_TABLET | Freq: Every day | ORAL | Status: DC | PRN

## 2015-04-09 MED ORDER — DIAZEPAM 2 MG PO TABS
2.0000 mg | ORAL_TABLET | Freq: Once | ORAL | Status: AC
  Administered 2015-04-09: 2 mg via ORAL
  Filled 2015-04-09: qty 1

## 2015-04-09 MED ORDER — HYDROMORPHONE HCL 1 MG/ML IJ SOLN
1.0000 mg | Freq: Once | INTRAMUSCULAR | Status: AC
  Administered 2015-04-09: 1 mg via INTRAMUSCULAR
  Filled 2015-04-09: qty 1

## 2015-04-09 NOTE — Discharge Instructions (Signed)

## 2015-04-09 NOTE — ED Notes (Signed)
Back pain, has been treated with Prednisone, Flexeril and Tramadol without relief. MRI scheduled but unable to have done, rescheduled for April 14, 2015. Pt concerned about needing to be out of work til the 8th or longer. Pain from lower back, shooting down both legs.

## 2015-04-12 NOTE — ED Provider Notes (Signed)
CSN: [id number]     Arrival date & time 04/09/15  0758 History   First MD Initiated Contact with Patient 04/09/15 [phone]     Chief Complaint  Patient presents with  . Back Pain     (Consider location/radiation/quality/duration/timing/severity/associated sxs/prior Treatment) HPI   40 year old female with back pain. Patient reports pain has been ongoing for several weeks. She denies any trauma. Pain is in lower back and radiates to the bilateral lower extremities. She has been taking multiple different medications including Flexeril, tramadol and prednisone without much relief. Reports she is scheduled for an upcoming open MRI on March 8. Pain is worse with movement. She has concerns about her ability to work because of the pain is requesting a note.  Past Medical History  Diagnosis Date  . Hypertension   . Murmur 09/17/2014  . Obesity (BMI 30.0-34.9) 09/17/2014   History reviewed. No pertinent past surgical history. Family History  Problem Relation Age of Onset  . Diabetes Mother   . Diabetes Father   . Hypertension Mother   . Hypertension Father   . Cancer Sister     breast  . Cancer Sister     lung   Social History  Substance Use Topics  . Smoking status: Never Smoker   . Smokeless tobacco: Never Used  . Alcohol Use: No   OB History    No data available     Review of Systems  All systems reviewed and negative, other than as noted in HPI.   Allergies  Review of patient's allergies indicates no known allergies.  Home Medications   Prior to Admission medications   Medication Sig Start Date End Date Taking? Authorizing Provider  ibuprofen (ADVIL,MOTRIN) 200 MG tablet Take 200 mg by mouth every 6 (six) hours as needed for headache, mild pain or moderate pain.    Historical Provider, MD  lisinopril-hydrochlorothiazide (PRINZIDE,ZESTORETIC) 20-12.5 MG per tablet Take 1 tablet by mouth daily.    Historical Provider, MD  meloxicam (MOBIC) 15 MG tablet Take 1 tablet (15 mg  total) by mouth daily as needed for pain. 04/09/15   Raeford RazorStephen Ardelia Wrede, MD   BP 132/83 mmHg  Pulse 82  Temp(Src) 98.2 F (36.8 C) (Oral)  Resp 18  SpO2 100%  LMP 03/29/2015 Physical Exam  Constitutional: She appears well-developed and well-nourished. No distress.  HENT:  Head: Normocephalic and atraumatic.  Eyes: Conjunctivae are normal. Right eye exhibits no discharge. Left eye exhibits no discharge.  Neck: Neck supple.  Cardiovascular: Normal rate, regular rhythm and normal heart sounds.  Exam reveals no gallop and no friction rub.   No murmur heard. Pulmonary/Chest: Effort normal and breath sounds normal. No respiratory distress.  Abdominal: Soft. She exhibits no distension. There is no tenderness.  Musculoskeletal: She exhibits no edema or tenderness.  Mild TTP across lower back. Both midline and paraspinally but not appreciably worse in midline. No concerning skin changes. Strength 5/5 b/l LE. Sensation intact to light touch. Palpable DP pulses. Deliberate but steady gait.  Neurological: She is alert.  Skin: Skin is warm and dry.  Psychiatric: She has a normal mood and affect. Her behavior is normal. Thought content normal.  Nursing note and vitals reviewed.   ED Course  Procedures (including critical care time) Labs Review Labs Reviewed - No data to display  Imaging Review No results found. I have personally reviewed and evaluated these images and lab results as part of my medical decision-making.   EKG Interpretation None  MDM   Final diagnoses:  Low back pain, unspecified back pain laterality, with sciatica presence unspecified    39yF with low back pain. No concerning "red flags." Nonfocal neuro exam. Symptomatic tx. Reports upcoming MRI. Return precautions discussed.     Raeford Razor, MD 04/12/15 [phone]

## 2015-04-14 ENCOUNTER — Ambulatory Visit
Admission: RE | Admit: 2015-04-14 | Discharge: 2015-04-14 | Disposition: A | Discharge status: Home / Self Care | Payer: Managed Care, Other (non HMO) | Source: Ambulatory Visit | Attending: Physician Assistant | Admitting: Physician Assistant

## 2015-12-16 ENCOUNTER — Inpatient Hospital Stay: Admit: 2015-12-16 | Payer: PRIVATE HEALTH INSURANCE | Attending: Orthopaedic Surgery | Primary: Internal Medicine

## 2015-12-16 DIAGNOSIS — Z01818 Encounter for other preprocedural examination: Secondary | ICD-10-CM

## 2015-12-16 LAB — METABOLIC PANEL, COMPREHENSIVE
A-G Ratio: 0.7 — ABNORMAL LOW (ref 1.1–2.2)
ALT (SGPT): 29 U/L (ref 12–78)
AST (SGOT): 29 U/L (ref 15–37)
Albumin: 3 g/dL — ABNORMAL LOW (ref 3.5–5.0)
Alk. phosphatase: 56 U/L (ref 45–117)
Anion gap: 6 mmol/L (ref 5–15)
BUN/Creatinine ratio: 15 (ref 12–20)
BUN: 12 MG/DL (ref 6–20)
Bilirubin, total: 0.1 MG/DL — ABNORMAL LOW (ref 0.2–1.0)
CO2: 29 mmol/L (ref 21–32)
Calcium: 8.5 MG/DL (ref 8.5–10.1)
Chloride: 105 mmol/L (ref 97–108)
Creatinine: 0.78 MG/DL (ref 0.55–1.02)
GFR est AA: >60 mL/min/{1.73_m2} (ref >60)
GFR est non-AA: >60 mL/min/{1.73_m2} (ref >60)
Globulin: 4.2 g/dL — ABNORMAL HIGH (ref 2.0–4.0)
Glucose: 87 mg/dL (ref 65–100)
Potassium: 3.3 mmol/L — ABNORMAL LOW (ref 3.5–5.1)
Protein, total: 7.2 g/dL (ref 6.4–8.2)
Sodium: 140 mmol/L (ref 136–145)

## 2015-12-16 LAB — CBC W/O DIFF
HCT: 36 % (ref 35.0–47.0)
HGB: 11.9 g/dL (ref 11.5–16.0)
MCH: 29.6 PG (ref 26.0–34.0)
MCHC: 33.1 g/dL (ref 30.0–36.5)
MCV: 89.6 FL (ref 80.0–99.0)
PLATELET: 300 10*3/uL (ref 150–400)
RBC: 4.02 M/uL (ref 3.80–5.20)
RDW: 13.9 % (ref 11.5–14.5)
WBC: 9.5 10*3/uL (ref 3.6–11.0)

## 2015-12-16 LAB — HEMOGLOBIN A1C WITH EAG
Est. average glucose: 120 mg/dL
Hemoglobin A1c: 5.8 % (ref 4.2–6.3)

## 2015-12-16 LAB — EKG, 12 LEAD, INITIAL
Atrial Rate: 89 {beats}/min
Calculated P Axis: 54 degrees
Calculated R Axis: 27 degrees
Calculated T Axis: 52 degrees
Diagnosis: NORMAL
P-R Interval: 150 ms
Q-T Interval: 390 ms
QRS Duration: 90 ms
QTC Calculation (Bezet): 474 ms
Ventricular Rate: 89 {beats}/min

## 2015-12-16 LAB — VITAMIN D, 25 HYDROXY: Vitamin D 25-Hydroxy: 10.3 ng/mL — ABNORMAL LOW (ref 30–100)

## 2015-12-16 LAB — URINALYSIS W/ REFLEX CULTURE
Bilirubin: NEGATIVE
Blood: NEGATIVE
Glucose: NEGATIVE mg/dL
Ketone: NEGATIVE mg/dL
Leukocyte Esterase: NEGATIVE
Nitrites: NEGATIVE
Protein: NEGATIVE mg/dL
Specific gravity: 1.021 (ref 1.003–1.030)
Urobilinogen: 0.2 EU/dL (ref 0.2–1.0)
pH (UA): 6.5 (ref 5.0–8.0)

## 2015-12-16 LAB — TYPE & SCREEN
ABO/Rh(D): A POS
Antibody screen: NEGATIVE

## 2015-12-16 LAB — PREALBUMIN: Prealbumin: 26.4 mg/dL (ref 20.0–40.0)

## 2015-12-16 LAB — PROTHROMBIN TIME + INR
INR: 1 (ref 0.9–1.1)
Prothrombin time: 10.4 s (ref 9.0–11.1)

## 2015-12-16 LAB — EKG 12-LEAD
Atrial Rate: 89 {beats}/min
Diagnosis: NORMAL
P Axis: 54 degrees
P-R Interval: 150 ms
Q-T Interval: 390 ms
QRS Duration: 90 ms
QTc Calculation (Bazett): 474 ms
R Axis: 27 degrees
T Axis: 52 degrees
Ventricular Rate: 89 {beats}/min

## 2015-12-16 LAB — TYPE AND SCREEN
ABO/Rh: A POS
Antibody Screen: NEGATIVE

## 2015-12-16 NOTE — Other (Signed)
[facility]  Preoperative Instructions        Surgery Date 12/21/15          Time of Arrival 1000  Contact # [phone]--cell    1. On the day of your surgery, please report to the Surgical Services Registration Desk and sign in at your designated time. The Surgery Center is located to the right of the Emergency Room.     2. You must have someone with you to drive you home. You should not drive a car for 24 hours following surgery. Please make arrangements for a friend or family member to stay with you for the first 24 hours after your surgery.    3. Do not have anything to eat or drink (including water, gum, mints, coffee, juice) after midnight ?12/20/15   .?This may not apply to medications prescribed by your physician. ?(Please note below the special instructions with medications to take the morning of your procedure.)    4. We recommend you do not drink any alcoholic beverages for 24 hours before and after your surgery.    5. Contact your surgeon???s office for instructions on the following medications: non-steroidal anti-inflammatory drugs (i.e. Advil, Aleve), vitamins, and supplements. (Some surgeon???s will want you to stop these medications prior to surgery and others may allow you to take them)  **If you are currently taking Plavix, Coumadin, Aspirin and/or other blood-thinning agents, contact your surgeon for instructions.** Your surgeon will partner with the physician prescribing these medications to determine if it is safe to stop or if you need to continue taking.  Please do not stop taking these medications without instructions from your surgeon    6. Wear comfortable clothes.  Wear glasses instead of contacts.  Do not bring any money or jewelry. Please bring picture ID, insurance card, and any prearranged co-payment or hospital payment.  Do not wear make-up, particularly mascara the morning of your surgery.  Do not wear nail  polish, particularly if you are having foot /hand surgery.  Wear your hair loose or down, no ponytails, buns, bobby pins or clips.  All body piercings must be removed.  Please shower with antibacterial soap for three consecutive days before and on the morning of surgery, but do not apply any lotions, powders or deodorants after the shower on the day of surgery. Please use a fresh towels after each shower. Please sleep in clean clothes and change bed linens the night before surgery.  Please do not shave for 48 hours prior to surgery. Shaving of the face is acceptable.    7. You should understand that if you do not follow these instructions your surgery may be cancelled.  If your physical condition changes (I.e. fever, cold or flu) please contact your surgeon as soon as possible.    8. It is important that you be on time.  If a situation occurs where you may be late, please call [phone] (OR Holding Area).    9. If you have any questions and or problems, please call [phone] (Pre-admission Testing).    10. Your surgery time may be subject to change.  You will receive a phone call the evening prior if your time changes.    11.  If having outpatient surgery, you must have someone to drive you here, stay with you during the duration of your stay, and to drive you home at time of discharge.    12.   In an effort to improve the efficiency, privacy,  and safety for all of our Pre-op patients visitors are not allowed in the Holding area.  Once you arrive and are registered your family/visitors will be asked to remain in the waiting room.  The Pre-op staff will get you from the Surgical Waiting Area and will explain to you and your family/visitors that the Pre-op phase is beginning.  The staff will answer any questions and provide instructions for tracking of the patient, by use of the existing tracking number and color-coded status board in the waiting room.  At this  time the staff will also ask for your designated spokesperson information in the event that the physician or staff need to provide an update or obtain any pertinent information. The designated spokesperson will be notified if the physician needs to speak to family during the pre-operative phase.  If at any time your family/visitors has questions or concerns they may approach the volunteer desk in the waiting area for assistance.         Special Instructions:Please use incentive spirometry as instructed in pre-op/please bring back to the hospital for use    MEDICATIONS TO TAKE THE MORNING OF SURGERY WITH A SIP OF WATER:gabapentin      I understand a pre-operative phone call will be made to verify my surgery time.  In the event that I am not available, I give permission for a message to be left on my answering service and/or with another person?  yes          ___________________      __________   _________    (Signature of Patient)             (Witness)                (Date and Time)

## 2015-12-16 NOTE — Other (Signed)
Incentive Spirometer        Using the incentive spirometer helps expand the small air sacs of your lungs, helps you breathe deeply, and helps improve your lung function.  Use your incentive spirometer twice a day (10 breaths each time) prior to surgery.      How to Use Your Incentive Spirometer:  1. Hold the incentive spirometer in an upright position.   2. Breathe out as usual.   3. Place the mouthpiece in your mouth and seal your lips tightly around it.   4. Take a deep breath.  Breathe in slowly and as deeply as possible. Keep the blue flow rate guide between the arrows.   5. Hold your breath as long as possible. Then exhale slowly and allow the piston to fall to the bottom of the column.   6. Rest for a few seconds and repeat steps one through five at least 10 times.     PAT Tidal Volume___1750_______________  x____3____________  Date____11/09/17___________________    Layla MawBRING THE INCENTIVE SPIROMETER WITH YOU TO THE HOSPITAL ON THE DAY OF YOUR SURGERY.  Opportunity given to ask and answer questions as well as to observe return demonstration.    Patient signature_____________________________    Witness____________________________

## 2015-12-16 NOTE — Other (Signed)
Preventing Infections Before ??? and After ??? Your Surgery  IMPORTANT INSTRUCTIONS    Please read and follow these instructions carefully.    Every Night for Three (3) nights before your surgery:  1. Shower with an antibacterial soap, such as Dial, or the soap provided at your preassessment appointment. A shower is better than a bath for cleaning your skin.  2. If needed, ask someone to help you reach all areas of your body. Don???t forget to clean your belly button with every shower.  The night before your surgery:  1. On the night before your surgery, shower with an antibacterial soap, such as Dial, or the soap provided at your preassessment appointment.   2. With one packet of Hibiclens in hand, turn water off.  3. Apply Hibiclens antiseptic skin cleanser with a clean, freshly washed washcloth.  ? Gently apply to your body from chin to toes (except the genital area) and especially the area(s) where your incision(s) will be.  ? Leave Hibiclens on your skin for at least 20 seconds.    CAUTION: If needed, Hibiclens may be used to clean the folds of skin of the legs (such as in the area of the groin) and on your buttocks and hips. However, do not use Hibiclens above the neck or in the genital area (your bottom) or put inside any area of your body.  4. Turn the water back on and rinse.  5. Dry gently with a clean, freshly washed towel.  6. After your shower, do not use any powder, deodorant, perfumes or lotion.  7. Use clean, freshly washed towels and washcloths every time you shower.  8. Wear clean, freshly washed pajamas to bed the night before surgery.  9. Sleep on clean, freshly washed sheets.  10. Do not allow pets to sleep in your bed with you.    The Morning of your surgery:  1. Shower again thoroughly with an antibacterial soap, such as Dial or the soap provided at your preassessment appointment. If needed, ask someone for help to reach all areas of your body. Don???t forget to clean your belly button! Rinse.   2. Dry gently with a clean, freshly washed towel.  3. After your shower, do not use any powder, deodorant, perfumes or lotion prior to surgery.  4. Put on clean, freshly washed clothing.    Tips to help prevent infections after your surgery:  1. Protect your surgical wound from germs:  ? Hand washing is the most important thing you and your caregivers can do to prevent infections.  ? Keep your bandage clean and dry!  ? Do not touch your surgical wound.  2. Use clean, freshly washed towels and washcloths every time you shower; do not share bath linens with others.  3. Until your surgical wound is healed, wear clothing and sleep on bed linens each day that are clean and freshly washed.  4. Do not allow pets to sleep in your bed with you or touch your surgical wound.  5. Do not smoke ??? smoking delays wound healing. This may be a good time to stop smoking.  6. If you have diabetes, it is important for you to manage your blood sugar levels properly before your surgery as well as after your surgery. Poorly managed blood sugar levels slow down wound healing and prevent you from healing completely.

## 2015-12-16 NOTE — Other (Signed)
Left voice message with Dr. Mammie Russianeis office for history/physical if available.

## 2015-12-16 NOTE — Undefined (Signed)
PC to pt regarding low Vitamin D level and need to take Vitamin D 2000 iu daily per recommendation from Dr. Mammie Russianeis.  Pt verbalized understanding and states she will start Vitamin D today

## 2015-12-17 LAB — CULTURE, MRSA

## 2015-12-17 NOTE — Other (Signed)
Philis NettleKerry Boyer aware of pre-op potassium result.Instruction/order for call to patient to encourage increased potassium intake (orange juice, banana,spinach,sweet potatoes etc.)  Patient verbalized understanding.

## 2015-12-18 LAB — CULTURE, URINE
Colonies Counted: >100000
Colony Count: >100000

## 2015-12-21 ENCOUNTER — Ambulatory Visit: Admit: 2015-12-21 | Payer: PRIVATE HEALTH INSURANCE | Primary: Internal Medicine

## 2015-12-21 ENCOUNTER — Inpatient Hospital Stay: Payer: PRIVATE HEALTH INSURANCE

## 2015-12-21 LAB — HCG URINE, QL. - POC: Pregnancy test,urine (POC): NEGATIVE

## 2015-12-21 MED ORDER — SODIUM CHLORIDE 0.9 % INJECTION
5 mg/mL | Freq: Four times a day (QID) | INTRAMUSCULAR | Status: DC | PRN
Start: 2015-12-21 — End: 2015-12-22

## 2015-12-21 MED ORDER — ROPIVACAINE (PF) 5 MG/ML (0.5 %) INJECTION
5 mg/mL (0. %) | Freq: Once | INTRAMUSCULAR | Status: DC
Start: 2015-12-21 — End: 2015-12-21

## 2015-12-21 MED ORDER — BACITRACIN 50,000 UNIT IM
50000 unit | INTRAMUSCULAR | Status: DC | PRN
Start: 2015-12-21 — End: 2015-12-21
  Administered 2015-12-21: 21:00:00

## 2015-12-21 MED ORDER — MIDAZOLAM 1 MG/ML IJ SOLN
1 mg/mL | INTRAMUSCULAR | Status: DC | PRN
Start: 2015-12-21 — End: 2015-12-21

## 2015-12-21 MED ORDER — HYDROXYZINE 10 MG TAB
10 mg | Freq: Three times a day (TID) | ORAL | Status: DC | PRN
Start: 2015-12-21 — End: 2015-12-22

## 2015-12-21 MED ORDER — FAMOTIDINE 20 MG TAB
20 mg | Freq: Two times a day (BID) | ORAL | Status: DC
Start: 2015-12-21 — End: 2015-12-22
  Administered 2015-12-22 (×2): via ORAL

## 2015-12-21 MED ORDER — DANIELS INJ SOLUTION (W/ MORPHINE) (PYXIS)
Freq: Once | INTRAMUSCULAR | Status: AC
Start: 2015-12-21 — End: 2015-12-21
  Administered 2015-12-21: 21:00:00 via INTRAMUSCULAR

## 2015-12-21 MED ORDER — HYDROCHLOROTHIAZIDE 25 MG TAB
25 mg | ORAL | Status: DC
Start: 2015-12-21 — End: 2015-12-22
  Administered 2015-12-22: 13:00:00 via ORAL

## 2015-12-21 MED ORDER — TRAMADOL 50 MG TAB
50 mg | Freq: Four times a day (QID) | ORAL | Status: DC | PRN
Start: 2015-12-21 — End: 2015-12-22

## 2015-12-21 MED ORDER — LACTATED RINGERS IV
INTRAVENOUS | Status: DC
Start: 2015-12-21 — End: 2015-12-21
  Administered 2015-12-21 (×2): via INTRAVENOUS

## 2015-12-21 MED ORDER — SODIUM CHLORIDE 0.9 % IV
INTRAVENOUS | Status: DC
Start: 2015-12-21 — End: 2015-12-21
  Administered 2015-12-21: 19:00:00 via INTRAVENOUS

## 2015-12-21 MED ORDER — CEFAZOLIN 2 G IN 100 ML 0.9% NS
2 gram/100 mL | Freq: Three times a day (TID) | INTRAVENOUS | Status: AC
Start: 2015-12-21 — End: 2015-12-22
  Administered 2015-12-22 (×2): via INTRAVENOUS

## 2015-12-21 MED ORDER — ACETAMINOPHEN 500 MG TAB
500 mg | ORAL | Status: AC
Start: 2015-12-21 — End: ?

## 2015-12-21 MED ORDER — HYDROMORPHONE 2 MG/ML INJECTION SOLUTION
2 mg/mL | INTRAMUSCULAR | Status: AC
Start: 2015-12-21 — End: ?

## 2015-12-21 MED ORDER — SODIUM CHLORIDE 0.9 % IJ SYRG
INTRAMUSCULAR | Status: AC
Start: 2015-12-21 — End: ?

## 2015-12-21 MED ORDER — SUCCINYLCHOLINE CHLORIDE 20 MG/ML INJECTION
20 mg/mL | INTRAMUSCULAR | Status: DC | PRN
Start: 2015-12-21 — End: 2015-12-21
  Administered 2015-12-21: 20:00:00 via INTRAVENOUS

## 2015-12-21 MED ORDER — DEXAMETHASONE SODIUM PHOSPHATE 4 MG/ML IJ SOLN
4 mg/mL | Freq: Once | INTRAMUSCULAR | Status: DC
Start: 2015-12-21 — End: 2015-12-22

## 2015-12-21 MED ORDER — DEXAMETHASONE SODIUM PHOSPHATE 4 MG/ML IJ SOLN
4 mg/mL | INTRAMUSCULAR | Status: DC | PRN
Start: 2015-12-21 — End: 2015-12-21
  Administered 2015-12-21: 20:00:00 via INTRAVENOUS

## 2015-12-21 MED ORDER — LACTATED RINGERS IV
INTRAVENOUS | Status: DC
Start: 2015-12-21 — End: 2015-12-21
  Administered 2015-12-21: 22:00:00 via INTRAVENOUS

## 2015-12-21 MED ORDER — MORPHINE 10 MG/ML INJ SOLUTION
10 mg/ml | INTRAMUSCULAR | Status: DC | PRN
Start: 2015-12-21 — End: 2015-12-21

## 2015-12-21 MED ORDER — NEOSTIGMINE METHYLSULFATE 1 MG/ML INJECTION
1 mg/mL | INTRAMUSCULAR | Status: DC | PRN
Start: 2015-12-21 — End: 2015-12-21
  Administered 2015-12-21: 21:00:00 via INTRAVENOUS

## 2015-12-21 MED ORDER — DIPHENHYDRAMINE HCL 50 MG/ML IJ SOLN
50 mg/mL | INTRAMUSCULAR | Status: DC | PRN
Start: 2015-12-21 — End: 2015-12-21

## 2015-12-21 MED ORDER — HYDROMORPHONE (PF) 1 MG/ML IJ SOLN
1 mg/mL | INTRAMUSCULAR | Status: DC | PRN
Start: 2015-12-21 — End: 2015-12-21

## 2015-12-21 MED ORDER — NALOXONE 0.4 MG/ML INJECTION
0.4 mg/mL | INTRAMUSCULAR | Status: DC | PRN
Start: 2015-12-21 — End: 2015-12-22

## 2015-12-21 MED ORDER — .PHARMACY TO SUBSTITUTE PER PROTOCOL
Status: DC | PRN
Start: 2015-12-21 — End: 2015-12-22

## 2015-12-21 MED ORDER — PHENYLEPHRINE IN 0.9 % SODIUM CL (40 MCG/ML) IV SYRINGE
0.4 mg/10 mL (40 mcg/mL) | INTRAVENOUS | Status: DC | PRN
Start: 2015-12-21 — End: 2015-12-21
  Administered 2015-12-21 (×2): via INTRAVENOUS

## 2015-12-21 MED ORDER — PREGABALIN 75 MG CAP
75 mg | Freq: Once | ORAL | Status: AC
Start: 2015-12-21 — End: 2015-12-21
  Administered 2015-12-21: 19:00:00 via ORAL

## 2015-12-21 MED ORDER — HYDROMORPHONE (PF) 1 MG/ML IJ SOLN
1 mg/mL | INTRAMUSCULAR | Status: DC | PRN
Start: 2015-12-21 — End: 2015-12-22

## 2015-12-21 MED ORDER — MIDAZOLAM 1 MG/ML IJ SOLN
1 mg/mL | INTRAMUSCULAR | Status: DC | PRN
Start: 2015-12-21 — End: 2015-12-21
  Administered 2015-12-21: 20:00:00 via INTRAVENOUS

## 2015-12-21 MED ORDER — KETOROLAC TROMETHAMINE 30 MG/ML INJECTION
30 mg/mL (1 mL) | Freq: Four times a day (QID) | INTRAMUSCULAR | Status: DC
Start: 2015-12-21 — End: 2015-12-22
  Administered 2015-12-21 – 2015-12-22 (×4): via INTRAVENOUS

## 2015-12-21 MED ORDER — ACETAMINOPHEN 500 MG TAB
500 mg | Freq: Four times a day (QID) | ORAL | Status: DC
Start: 2015-12-21 — End: 2015-12-22
  Administered 2015-12-22 (×4): via ORAL

## 2015-12-21 MED ORDER — HYDROMORPHONE (PF) 2 MG/ML IJ SOLN
2 mg/mL | INTRAMUSCULAR | Status: DC | PRN
Start: 2015-12-21 — End: 2015-12-21
  Administered 2015-12-21: 20:00:00 via INTRAVENOUS

## 2015-12-21 MED ORDER — ONDANSETRON (PF) 4 MG/2 ML INJECTION
4 mg/2 mL | INTRAMUSCULAR | Status: DC | PRN
Start: 2015-12-21 — End: 2015-12-21

## 2015-12-21 MED ORDER — BACITRACIN 50,000 UNIT IM
50000 unit | INTRAMUSCULAR | Status: AC
Start: 2015-12-21 — End: ?

## 2015-12-21 MED ORDER — GLYCOPYRROLATE 0.2 MG/ML IJ SOLN
0.2 mg/mL | INTRAMUSCULAR | Status: DC | PRN
Start: 2015-12-21 — End: 2015-12-21
  Administered 2015-12-21: 21:00:00 via INTRAVENOUS

## 2015-12-21 MED ORDER — LIDOCAINE (PF) 10 MG/ML (1 %) IJ SOLN
10 mg/mL (1 %) | INTRAMUSCULAR | Status: DC | PRN
Start: 2015-12-21 — End: 2015-12-21

## 2015-12-21 MED ORDER — SODIUM CHLORIDE 0.9 % IJ SYRG
Freq: Three times a day (TID) | INTRAMUSCULAR | Status: DC
Start: 2015-12-21 — End: 2015-12-21
  Administered 2015-12-21: 19:00:00 via INTRAVENOUS

## 2015-12-21 MED ORDER — SENNOSIDES-DOCUSATE SODIUM 8.6 MG-50 MG TAB
Freq: Two times a day (BID) | ORAL | Status: DC
Start: 2015-12-21 — End: 2015-12-22
  Administered 2015-12-22 (×2): via ORAL

## 2015-12-21 MED ORDER — SODIUM CHLORIDE 0.9 % IJ SYRG
INTRAMUSCULAR | Status: DC | PRN
Start: 2015-12-21 — End: 2015-12-21

## 2015-12-21 MED ORDER — CEFAZOLIN 2 G IN 100 ML 0.9% NS
2 gram/100 mL | Freq: Once | INTRAVENOUS | Status: AC
Start: 2015-12-21 — End: 2015-12-21
  Administered 2015-12-21: 20:00:00 via INTRAVENOUS

## 2015-12-21 MED ORDER — MIDAZOLAM 1 MG/ML IJ SOLN
1 mg/mL | INTRAMUSCULAR | Status: AC
Start: 2015-12-21 — End: ?

## 2015-12-21 MED ORDER — FENTANYL CITRATE (PF) 50 MCG/ML IJ SOLN
50 mcg/mL | INTRAMUSCULAR | Status: AC
Start: 2015-12-21 — End: ?

## 2015-12-21 MED ORDER — ONDANSETRON (PF) 4 MG/2 ML INJECTION
4 mg/2 mL | INTRAMUSCULAR | Status: DC | PRN
Start: 2015-12-21 — End: 2015-12-21
  Administered 2015-12-21: 21:00:00 via INTRAVENOUS

## 2015-12-21 MED ORDER — LIDOCAINE (PF) 20 MG/ML (2 %) IJ SOLN
20 mg/mL (2 %) | INTRAMUSCULAR | Status: DC | PRN
Start: 2015-12-21 — End: 2015-12-21
  Administered 2015-12-21: 20:00:00 via INTRAVENOUS

## 2015-12-21 MED ORDER — OXYCODONE 5 MG TAB
5 mg | ORAL | Status: DC | PRN
Start: 2015-12-21 — End: 2015-12-22

## 2015-12-21 MED ORDER — PHENOL 1.4 % MUCOSAL AEROSOL SPRAY
1.4 % | Status: DC | PRN
Start: 2015-12-21 — End: 2015-12-22

## 2015-12-21 MED ORDER — EPHEDRINE SULFATE 50 MG/ML IJ SOLN
50 mg/mL | INTRAMUSCULAR | Status: DC | PRN
Start: 2015-12-21 — End: 2015-12-21

## 2015-12-21 MED ORDER — ROCURONIUM 10 MG/ML IV
10 mg/mL | INTRAVENOUS | Status: DC | PRN
Start: 2015-12-21 — End: 2015-12-21
  Administered 2015-12-21 (×3): via INTRAVENOUS

## 2015-12-21 MED ORDER — LACTATED RINGERS IV
INTRAVENOUS | Status: DC
Start: 2015-12-21 — End: 2015-12-21
  Administered 2015-12-21: 19:00:00 via INTRAVENOUS

## 2015-12-21 MED ORDER — POLYETHYLENE GLYCOL 3350 17 GRAM (100 %) ORAL POWDER PACKET
17 gram | Freq: Every day | ORAL | Status: DC
Start: 2015-12-21 — End: 2015-12-22
  Administered 2015-12-22: 13:00:00 via ORAL

## 2015-12-21 MED ORDER — ONDANSETRON (PF) 4 MG/2 ML INJECTION
4 mg/2 mL | INTRAMUSCULAR | Status: DC | PRN
Start: 2015-12-21 — End: 2015-12-22

## 2015-12-21 MED ORDER — DIAZEPAM 5 MG TAB
5 mg | Freq: Four times a day (QID) | ORAL | Status: DC | PRN
Start: 2015-12-21 — End: 2015-12-22

## 2015-12-21 MED ORDER — BISACODYL 10 MG RECTAL SUPPOSITORY
10 mg | Freq: Every day | RECTAL | Status: DC | PRN
Start: 2015-12-21 — End: 2015-12-22

## 2015-12-21 MED ORDER — PROPOFOL 10 MG/ML IV EMUL
10 mg/mL | INTRAVENOUS | Status: DC | PRN
Start: 2015-12-21 — End: 2015-12-21
  Administered 2015-12-21: 20:00:00 via INTRAVENOUS

## 2015-12-21 MED ORDER — PHENYLEPHRINE IN 0.9 % SODIUM CL (40 MCG/ML) IV SYRINGE
0.4 mg/10 mL (40 mcg/mL) | INTRAVENOUS | Status: AC
Start: 2015-12-21 — End: ?

## 2015-12-21 MED ORDER — ACETAMINOPHEN 500 MG TAB
500 mg | Freq: Once | ORAL | Status: AC
Start: 2015-12-21 — End: 2015-12-21
  Administered 2015-12-21: 19:00:00 via ORAL

## 2015-12-21 MED ORDER — FENTANYL CITRATE (PF) 50 MCG/ML IJ SOLN
50 mcg/mL | INTRAMUSCULAR | Status: DC | PRN
Start: 2015-12-21 — End: 2015-12-21
  Administered 2015-12-21: 20:00:00 via INTRAVENOUS

## 2015-12-21 MED ORDER — BENZOCAINE-MENTHOL 15 MG-10 MG LOZENGES
15-10 mg | Status: DC | PRN
Start: 2015-12-21 — End: 2015-12-22

## 2015-12-21 MED ORDER — DANIELS INJ SOLUTION (W/ MORPHINE) (PYXIS)
INTRAMUSCULAR | Status: AC
Start: 2015-12-21 — End: ?

## 2015-12-21 MED ORDER — FENTANYL CITRATE (PF) 50 MCG/ML IJ SOLN
50 mcg/mL | INTRAMUSCULAR | Status: DC | PRN
Start: 2015-12-21 — End: 2015-12-21

## 2015-12-21 MED ORDER — SODIUM CHLORIDE 0.9 % IJ SYRG
Freq: Three times a day (TID) | INTRAMUSCULAR | Status: DC
Start: 2015-12-21 — End: 2015-12-22
  Administered 2015-12-22: 11:00:00 via INTRAVENOUS

## 2015-12-21 MED ORDER — OXYCODONE 5 MG TAB
5 mg | ORAL | Status: DC | PRN
Start: 2015-12-21 — End: 2015-12-21

## 2015-12-21 MED ORDER — MEPERIDINE (PF) 25 MG/ML INJ SOLUTION
25 mg/ml | INTRAMUSCULAR | Status: DC | PRN
Start: 2015-12-21 — End: 2015-12-21

## 2015-12-21 MED ORDER — SODIUM CHLORIDE 0.9 % IV
INTRAVENOUS | Status: AC
Start: 2015-12-21 — End: 2015-12-22
  Administered 2015-12-21: 22:00:00 via INTRAVENOUS

## 2015-12-21 MED ORDER — CHOLECALCIFEROL (VITAMIN D3) 1,000 UNIT (25 MCG) TAB
Freq: Every day | ORAL | Status: DC
Start: 2015-12-21 — End: 2015-12-22
  Administered 2015-12-22: 13:00:00 via ORAL

## 2015-12-21 MED ORDER — SODIUM CHLORIDE 0.9 % IJ SYRG
INTRAMUSCULAR | Status: DC | PRN
Start: 2015-12-21 — End: 2015-12-22

## 2015-12-21 MED FILL — LACTATED RINGERS IV: INTRAVENOUS | Qty: 1000

## 2015-12-21 MED FILL — LYRICA 75 MG CAPSULE: 75 mg | ORAL | Qty: 2

## 2015-12-21 MED FILL — BD POSIFLUSH NORMAL SALINE 0.9 % INJECTION SYRINGE: INTRAMUSCULAR | Qty: 10

## 2015-12-21 MED FILL — DANIELS INJ SOLUTION (W/ MORPHINE) (PYXIS): INTRAMUSCULAR | Qty: 100

## 2015-12-21 MED FILL — CEFAZOLIN 2 G IN 100 ML 0.9% NS: 2 gram/100 mL | INTRAVENOUS | Qty: 100

## 2015-12-21 MED FILL — PHENYLEPHRINE IN 0.9 % SODIUM CL (40 MCG/ML) IV SYRINGE: 0.4 mg/10 mL (40 mcg/mL) | INTRAVENOUS | Qty: 10

## 2015-12-21 MED FILL — HYDROMORPHONE 2 MG/ML INJECTION SOLUTION: 2 mg/mL | INTRAMUSCULAR | Qty: 1

## 2015-12-21 MED FILL — BACITRACIN 50,000 UNIT IM: 50000 unit | INTRAMUSCULAR | Qty: 50000

## 2015-12-21 MED FILL — SODIUM CHLORIDE 0.9 % IV: INTRAVENOUS | Qty: 1000

## 2015-12-21 MED FILL — FENTANYL CITRATE (PF) 50 MCG/ML IJ SOLN: 50 mcg/mL | INTRAMUSCULAR | Qty: 2

## 2015-12-21 MED FILL — MIDAZOLAM 1 MG/ML IJ SOLN: 1 mg/mL | INTRAMUSCULAR | Qty: 2

## 2015-12-21 MED FILL — MAPAP EXTRA STRENGTH 500 MG TABLET: 500 mg | ORAL | Qty: 2

## 2015-12-21 NOTE — Anesthesia Pre-Procedure Evaluation (Signed)
Anesthetic History   No history of anesthetic complications            Review of Systems / Medical History  Patient summary reviewed, nursing notes reviewed and pertinent labs reviewed    Pulmonary  Within defined limits                 Neuro/Psych   Within defined limits           Cardiovascular    Hypertension                   GI/Hepatic/Renal  Within defined limits              Endo/Other        Obesity     Other Findings              Physical Exam    Airway  Mallampati: II  TM Distance: > 6 cm  Neck ROM: normal range of motion   Mouth opening: Normal     Cardiovascular  Regular rate and rhythm,  S1 and S2 normal,  no murmur, click, rub, or gallop             Dental  No notable dental hx       Pulmonary  Breath sounds clear to auscultation               Abdominal  GI exam deferred       Other Findings            Anesthetic Plan    ASA: 2  Anesthesia type: general          Induction: Intravenous  Anesthetic plan and risks discussed with: Patient

## 2015-12-21 NOTE — Brief Op Note (Signed)
BRIEF OPERATIVE NOTE    Date of Procedure: 12/21/2015   Preoperative Diagnosis: LUMBAGO  HNP   RADICULOPATHY   Postoperative Diagnosis: lumbago, herniated nucleus pulposus    Procedure(s):  RIGHT L5-S1 DISCECTOMY  Surgeon(s) and Role:     * Job FoundsAbilio A Lizzie Cokley, MD - Primary         Assistant Staff:  Physician Assistant: Jeralyn BennettMatthew P Carleton, PA    Surgical Staff:  Circ-1: Adrian SaranLisa R Sager, RN; Isaias CowmanSusan K Zvolensky, RN  Circ-Intern: Hendricks Limesachel E George  Physician Assistant: Jeralyn BennettMatthew P Carleton, PA  Scrub Tech-1: Wandalee FerdinandKevin A Garrett  Scrub Tech-Relief: Prince RomeMarian G Young  Event Time In   Incision Start 1523   Incision Close      Anesthesia: General   Estimated Blood Loss: 10cc  Specimens: * No specimens in log *   Findings: HNP   Complications: None  Implants: * No implants in log *

## 2015-12-21 NOTE — Anesthesia Post-Procedure Evaluation (Signed)
Post-Anesthesia Evaluation and Assessment    Patient: [name] Mclean MRN: [id number]  SSN: [id number]    Date of Birth: 1975/05/20  Age: 40 y.o.  Sex: female       Cardiovascular Function/Vital Signs  Visit Vitals   ??? BP 142/71   ??? Pulse 77   ??? Temp 36.6 ??C (97.8 ??F)   ??? Resp 11   ??? Ht 5\' 9"  (1.753 m)   ??? Wt 100.5 kg (221 lb 9 oz)   ??? SpO2 99%   ??? BMI 32.72 kg/m2       Patient is status post general anesthesia for Procedure(s):  RIGHT L5-S1 DISCECTOMY.    Nausea/Vomiting: None    Postoperative hydration reviewed and adequate.    Pain:  Pain Scale 1: Numeric (0 - 10) (12/21/15 1645)  Pain Intensity 1: 0 (12/21/15 1645)   Managed    Neurological Status:   Neuro (WDL): Exceptions to WDL (12/21/15 1615)  Neuro  Neurologic State: Drowsy (12/21/15 1615)  Orientation Level: Oriented X4 (12/21/15 1615)  Cognition: Follows commands (12/21/15 1615)  Speech: Clear (12/21/15 1615)  LUE Motor Response: Purposeful (12/21/15 1615)  LLE Motor Response: Purposeful (12/21/15 1615)  RUE Motor Response: Purposeful (12/21/15 1615)  RLE Motor Response: Purposeful (12/21/15 1615)   At baseline    Mental Status and Level of Consciousness: Arousable    Pulmonary Status:   O2 Device: Nasal cannula (12/21/15 1645)   Adequate oxygenation and airway patent    Complications related to anesthesia: None    Post-anesthesia assessment completed. No concerns    Signed By: Nicanor BakePaul C Langley Ingalls, MD     December 21, 2015

## 2015-12-21 NOTE — Other (Addendum)
perl- right and left temporal pulse palp. Smile and tongue pertrussion symmetrical. Grasp strong equal  to the left and right  hands.  Right and left radial pulse palp. Brisk cap refill right and left dp and pt pulses palp. Brisk cap refill.  Strong foot pushes bilaterally.  Pt alert and oriented x4.  DR. Apolinar JunesBRANDON AWARE OF THE K READING FROM PAT STATES  NO ADDITIONAL LAB WORK NEEDED PT HAS BEEN EATING BANANAS AS DIRECTED.

## 2015-12-21 NOTE — H&P (Signed)
[name] Sato, MD   Orthopedic Surgery    Low back pain, unspecified back pain laterality, unspecified chronicity, with sciatica presence unspecified +3 more   Dx    Lower Back - Follow-up   Reason for visit    Progress notes       Subjective:   ??  Patient ID: [name] Mclean is a 40 y.o. female.  ??  Chief Complaint: Follow-up of the Lower Back  ??  ??  HPI:  [name] Mclean is a 40 y.o. female with complaints of lower back pain that radiates down the right leg posteriorly. No left sided symptoms.  It is rated 7-8 out of 10 on the VAS.  She reports limping due to pain. She is unable to do her daily activities such as helping her daughter get ready for school due to the pain.  She has been unable to do physical therapy due to the extent of her pain. She is taking gabapentin and flexeril.   ??  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   ??       Patient Active Problem List   ?? Diagnosis Date Noted   ??? Displacement of lumbar intervertebral disc without myelopathy 12/08/2015   ??? Low back pain 11/29/2015   ??? Lumbosacral spondylosis without myelopathy 11/29/2015   ??? Sciatica of right side 11/29/2015   ??  ??  ??  Current Outpatient Prescriptions:   ???  cyclobenzaprine (FLEXERIL) 10 MG tablet, Take 1 tablet (10 mg total) by mouth 3 (three) times a day as needed for muscle spasms., Disp: 90 tablet, Rfl: 2  ???  dexamethasone (DECADRON) 4 MG tablet, Take 1 tablet (4 mg total) by mouth 2 (two) times a day with meals., Disp: 14 tablet, Rfl: 0  ???  gabapentin (NEURONTIN) 300 MG capsule, Take 1 capsule (300 mg total) by mouth See admin instructions. 1 daily for 3 days, then 1 bid for 3 days, then 1 tid, Disp: 90 capsule, Rfl: 2  ???  hydrochlorothiazide (HYDRODIURIL) 25 MG tablet, Take 25 mg by mouth daily., Disp: , Rfl:   ???  ibuprofen (ADVIL,MOTRIN) 800 MG tablet, Take 1 tablet (800 mg total) by mouth 3 (three) times a day., Disp: 90 tablet, Rfl: 2  ???  predniSONE (DELTASONE) 20 MG tablet, Take 20 mg by mouth daily., Disp:  , Rfl:   ???  traMADol (ULTRAM) 50 MG tablet, Take 50 mg by mouth every 6 (six) hours as needed for moderate pain., Disp: , Rfl:   ??  No Known Allergies  ??  ROS:   No new bowel or bladder incontinence.  No fever.  No saddle anesthesia.  ??       Objective:   There were no vitals filed for this visit.  ??  Body mass index is 31.01 kg/(m^2)., a BMI over 30 is considered obese and a BMI over 40 has been associated with a higher risk of surgical complications.  ??  Constitutional:??No acute distress. Well nourished.  HEENT: Normocephalic.  Respiratory:?? No labored breathing.  Cardiovascular:?? No marked cyanosis.  Skin:?? No marked skin ulcers/lesions on bilateral upper or lower extremities.  Psychiatric: Alert and oriented x3.  Inspection: No gross deformity of bilateral upper or lower extremities.  Musculoskeletal/Neurological:   Gait/balance:  - Normal. Able to walk on heels and toes.  Thoracolumbar spine:  - No tenderness to palpation  - Full range of motion.  Right lower extremity:  - No tenderness to palpation   -  Full range of motion  - No pain with internal/external rotation of the hip  - Strength:  - 5 out of 5 to hip flexors  - 5 out of 5 to knee extensors  - 5 out of 5 to ankle dorsiflexors  - 5 out of 5 to great toe extensors  - 5 out of 5 to ankle plantar flexors  - Negative straight leg raise  Left lower extremity:  - No tenderness to palpation   - Full range of motion  - No pain with internal/external rotation of the hip  - Strength:  - 5 out of 5 to hip flexors  - 5 out of 5 to knee extensors  - 5 out of 5 to ankle dorsiflexors  - 5 out of 5 to great toe extensors  - 5 out of 5 to ankle plantar flexors  - Negative straight leg raise  Sensation:  - Intact to light touch  Reflexes:  - +2 Patellar tendon   - +2 Achilles tendon   Downgoing Babinski's    ??  ??  Radiographs:??   ????  X-ray Lumbar Spine Complete 4+ Views ([id number])  ??  Result Date: 11/11/2015   Standing. AP, Lat, Flexion, Extension. Notes Pregnant: no Indication(s): Pain Impression: degenerative disc disease at L5-S1 and a list to the left; no fracture dislocation or instability   ??  Mri Lumbar Spine Without Contrast ([id number])  ??  Result Date: 12/06/2015  HISTORY: Low back pain. PROCEDURE: Sagittal and axial and coronal images were obtained through the spine in various sequences. FINDINGS: Previous studies are not available. Sagittal images demonstrate normal alignment. Moderate to severe multilevel degenerative disc disease. Parasagittal images demonstrate no significant foraminal stenosis. STIR sequences demonstrate no significant bone marrow edema and no compression deformity. The conus medullaris is in a normal position and has a normal appearance. No bony destructive process is identified. Axial images through L1-L2 demonstrate no HNP or spinal stenosis. Axial images through L2-L3 demonstrate no HNP or spinal stenosis. Axial images through L3-L4 demonstrate mild disc bulge. Mild facet arthropathy. Axial images through L4-L5 demonstrate mild disc bulge. Mild to moderate facet arthropathy. Axial images through L5-S1 demonstrate large central and right-sided HNP displacing the originating right S1 nerve root but also abutting the left S1 nerve root. IMPRESSION: Large central and right-sided HNP at L5-S1 displacing the originating right S1 nerve root but also abutting the left S1 nerve root.  [name]  ??      I reviewed the MRI results and agree with the findings.    Assessment:   ??  1. Low back pain, unspecified back pain laterality, unspecified chronicity, with sciatica presence unspecified    2. Sciatica of right side    3. Lumbosacral spondylosis without myelopathy    4. Displacement of lumbar intervertebral disc without myelopathy    ??       Plan:   ??  We reviewed the results of her MRI, and discussed that her pain is a result of a herniated disc at L5-S1. We discussed the treatment options  for her pain. With the extent of her current pain and failure of two injections, she is a candidate for right L5-S1 microdiscectomy.   ??  Work note given for 6 weeks following surgery and for 12/09/15.   ??  I have discussed the procedure in detail with the patient and mentioned complications, including but not limited to: death, permanent disability, heart attack, stroke, lung injury or infection, blindness, ileus, bladder  or bowel problems, ureter injury, bleeding, nerve injury (including numbness, pain and weakness), paralysis (which may be permanent), failure to heal, failure to fuse bone together in fusion procedures, failure to relief symptoms, failure to relief pain, increased pain, need for further surgeries, failure or breakage or hardware, malpositioning of hardware, need to fuse or operate on additional levels determined either during or after surgery, destabilization of the spine (which may require fusion or later surgery), infections (which may or may not require additional surgery), dural tears (tears of the sac holding in nerves and spinal fluid), meningitis, voice changes, vocal cord injury, hoarseness, blood clots, pulmonary embolus, Horner syndrome, recurrent disc herniation, diaphragm paralysis, and anesthetic complications. Comorbidities such as obesity, smoking, rheumatoid arthritis, chronic steroid use and diabetes increase these risks. The patient understands and wants to proceed.   ????  ??      Orders Placed This Encounter   ??? BMI >=25 PATIENT INSTRUCTIONS & EDUCATION      Return for Follow up 2 weeks after surgery.   ??      [name] SatoAbilio Reis, MD   ??  ITheodis Mclean, Abilio Reis, MD, personally, performed the services described in this documentation, as scribed in my presence, and it is both accurate and complete.  Scribed by: Telford NabShelby Feliciano    ??  This note has been transcribed electronically using voice recognition and is believed to be accurate, but may contain errors secondary to technological limitations

## 2015-12-21 NOTE — Other (Addendum)
1614-Handoff Report from Operating Room to PACU    Report received from L Purvis SheffieldSager RN and C Elder CRNA regarding [name] Mclean.      Surgeon(s):  Job FoundsAbilio A Reis, MD  And Procedure(s) (LRB):  RIGHT L5-S1 DISCECTOMY (N/A)  confirmed   with allergies and dressings discussed.    Anesthesia type, drugs, patient history, complications, estimated blood loss, vital signs, intake and output, and last pain medication, lines, reversal medications and temperature were reviewed.    1710- Family updated.     1755-TRANSFER - OUT REPORT:    Verbal report given to Sarah RN(name) on [name] Beesanisha Ontiveros  being transferred to ortho(unit) for routine post - op       Report consisted of patient???s Situation, Background, Assessment and   Recommendations(SBAR).     Information from the following report(s) SBAR, Kardex, OR Summary, Procedure Summary, Intake/Output, MAR and Recent Results was reviewed with the receiving nurse.    Opportunity for questions and clarification was provided.      Patient transported with:   [employer]

## 2015-12-21 NOTE — Op Note (Signed)
[facility]   [address]   [place], [place] [id number]   OP NOTE       Name:  [name] BeesLSTON, [name]   MR#:  [id number]   DOB:  13-Oct-1975   Account #:  [id number]    Surgery Date:  12/21/2015   Date of Adm:  12/21/2015       PREOPERATIVE DIAGNOSES   1. L5-S1 disk herniation on the right.   2. Sciatica.   3. Lumbago.    POSTOPERATIVE DIAGNOSES   1. L5-S1 disk herniation on the right.   2. Sciatica.   3. Lumbago.    PROCEDURES PERFORMED   1. L5-S1 microdiskectomy.   2. Use of operative microscope.    SURGEON: [name] SatoAbilio Karine Garn, MD    FIRST ASSISTANT: [name] ClossMatthew P. Carleton, PA-C    ESTIMATED BLOOD LOSS: Less than 10 mL.    COMPLICATIONS: None.    SPECIMENS REMOVED: None.    ANESTHESIA: General.     DRAINS: None.     INDICATIONS FOR PROCEDURE: The patient is a 40 year old lady   with an L5-S1 disk herniation, resulting in lumbago and sciatica that   has failed to improve with nonoperative treatment. At this point, she   would like to proceed with surgical intervention. I have given her   warnings about the possible complications including but not limited to   pain, scar, bleeding, infection, nonunion, damage to surrounding   structures, death, paralysis, blindness, stroke. She understands and   wants to proceed.    DESCRIPTION OF PROCEDURE: Informed consent was obtained   and the operative site was properly marked. The patient was moved   back to the operating room and underwent general endotracheal   anesthesia, was positioned prone on operating table, using [name] RosenthalJackson   table 4 poster frame. Arms were placed in a 90/90 position. The knees   were gently bent with pillows. Fluoroscopy was used to mark the level   of the incision and proceeded to prep and drape in the usual manner.   Time-out was obtained, verifying that this was the correct patient, the   correct surgery, the correct site, as well as that she had received IV   antibiotics within 30 minutes of the incision.      I then proceeded to  perform my standard posterior approach to the   lumbar spine, exposing the area between L5 and S1. Once the area   was exposed on the right side, I proceeded to verify with fluoroscopy   that we were in the correct level. I then proceeded to bring in the   operative microscope and keep it in place for the remainder of the   procedure. A Midas Rex was used to perform a U cut laminectomy at   L5 and the bone was removed with a pituitary. The ligamentum flavum   was detached from superior leading edge with a curved curette and   flipped into the bony defect. Kerrison #3 was used to remove   ligamentum flavum and the medial overhang of the facet, freeing the   thecal sac. I was able then to gently retract the thecal sac with a   Penfield 4 and find a disk herniation that was subligamentous. Once   the retractor was in place, I proceeded to use a 15 blade to perform a   linear annulotomy and remove the free fragments with a micropituitary.   The annular defect was found and was explored and irrigated  with   normal saline until no further free fragments were present. Once the   area was fully decompressed, I irrigated the wound using normal   saline, verified that hemostasis was obtained and I closed the fascia   with #1 Vicryl figure-of-eight interrupted sutures, irrigated the subcu,   closed subcu with 2-0 Vicryl, the skin with a 3-0 running Monocryl.   Dermabond was applied. The patient was then awakened, transferred   to the PACU in stable condition.    POSTOPERATIVE PLAN: The patient is going to remain here   overnight. We are going to give her SCDs and TED hose for DVT   prophylaxis and Ancef for infection prophylaxis.        [name] SatoABILIO [name] Ferrebee, MD      AR / SB   D:  12/21/2015   15:56   T:  12/22/2015   01:46   Job #:  [id number]

## 2015-12-22 MED ORDER — ACETAMINOPHEN 500 MG TAB
500 mg | ORAL_TABLET | Freq: Four times a day (QID) | ORAL | 0 refills | Status: AC
Start: 2015-12-22 — End: 2016-01-05

## 2015-12-22 MED ORDER — POLYETHYLENE GLYCOL 3350 100 % ORAL POWDER
17 gram/dose | Freq: Every day | ORAL | 0 refills | Status: AC
Start: 2015-12-22 — End: 2016-01-06

## 2015-12-22 MED ORDER — GABAPENTIN 100 MG CAP
100 mg | Freq: Three times a day (TID) | ORAL | Status: DC
Start: 2015-12-22 — End: 2015-12-22
  Administered 2015-12-22: 18:00:00 via ORAL

## 2015-12-22 MED ORDER — SENNOSIDES-DOCUSATE SODIUM 8.6 MG-50 MG TAB
ORAL_TABLET | Freq: Two times a day (BID) | ORAL | 0 refills | Status: AC
Start: 2015-12-22 — End: ?

## 2015-12-22 MED ORDER — OXYCODONE 5 MG TAB
5 mg | ORAL_TABLET | ORAL | 0 refills | Status: AC | PRN
Start: 2015-12-22 — End: ?

## 2015-12-22 MED FILL — ONDANSETRON (PF) 4 MG/2 ML INJECTION: 4 mg/2 mL | INTRAMUSCULAR | Qty: 2

## 2015-12-22 MED FILL — QUELICIN 20 MG/ML INJECTION SOLUTION: 20 mg/mL | INTRAMUSCULAR | Qty: 10

## 2015-12-22 MED FILL — BD POSIFLUSH NORMAL SALINE 0.9 % INJECTION SYRINGE: INTRAMUSCULAR | Qty: 10

## 2015-12-22 MED FILL — NEOSTIGMINE METHYLSULFATE 1 MG/ML INJECTION: 1 mg/mL | INTRAMUSCULAR | Qty: 10

## 2015-12-22 MED FILL — MAPAP EXTRA STRENGTH 500 MG TABLET: 500 mg | ORAL | Qty: 2

## 2015-12-22 MED FILL — KETOROLAC TROMETHAMINE 30 MG/ML INJECTION: 30 mg/mL (1 mL) | INTRAMUSCULAR | Qty: 1

## 2015-12-22 MED FILL — VITAMIN D3 25 MCG (1,000 UNIT) TABLET: 25 mcg (1,000 unit) | ORAL | Qty: 1

## 2015-12-22 MED FILL — ROCURONIUM 10 MG/ML IV: 10 mg/mL | INTRAVENOUS | Qty: 5

## 2015-12-22 MED FILL — DIPRIVAN 10 MG/ML INTRAVENOUS EMULSION: 10 mg/mL | INTRAVENOUS | Qty: 20

## 2015-12-22 MED FILL — GLYCOPYRROLATE 0.2 MG/ML IJ SOLN: 0.2 mg/mL | INTRAMUSCULAR | Qty: 1

## 2015-12-22 MED FILL — GABAPENTIN 100 MG CAP: 100 mg | ORAL | Qty: 1

## 2015-12-22 MED FILL — SORE THROAT (PHENOL) 1.4 % AEROSOL SPRAY: 1.4 % | Qty: 177

## 2015-12-22 MED FILL — VITAMIN D3 25 MCG (1,000 UNIT) TABLET: 25 mcg (1,000 unit) | ORAL | Qty: 2

## 2015-12-22 MED FILL — DOK PLUS 8.6 MG-50 MG TABLET: ORAL | Qty: 1

## 2015-12-22 MED FILL — HYDROCHLOROTHIAZIDE 25 MG TAB: 25 mg | ORAL | Qty: 1

## 2015-12-22 MED FILL — HEALTHYLAX 17 GRAM ORAL POWDER PACKET: 17 gram | ORAL | Qty: 1

## 2015-12-22 MED FILL — FAMOTIDINE 20 MG TAB: 20 mg | ORAL | Qty: 1

## 2015-12-22 MED FILL — PHENYLEPHRINE IN 0.9 % SODIUM CL (40 MCG/ML) IV SYRINGE: 0.4 mg/10 mL (40 mcg/mL) | INTRAVENOUS | Qty: 10

## 2015-12-22 MED FILL — CEFAZOLIN 2 G IN 100 ML 0.9% NS: 2 gram/100 mL | INTRAVENOUS | Qty: 100

## 2015-12-22 MED FILL — OXYCODONE 5 MG TAB: 5 mg | ORAL | Qty: 1

## 2015-12-22 MED FILL — DEXAMETHASONE SODIUM PHOSPHATE 4 MG/ML IJ SOLN: 4 mg/mL | INTRAMUSCULAR | Qty: 5

## 2015-12-22 MED FILL — LIDOCAINE (PF) 20 MG/ML (2 %) IJ SOLN: 20 mg/mL (2 %) | INTRAMUSCULAR | Qty: 5

## 2015-12-22 NOTE — Other (Signed)
I have reviewed discharge instructions with the patient.  The patient verbalized understanding.

## 2015-12-22 NOTE — Op Note (Signed)
[facility]   [address]   [place], [place] [id number]   OP NOTE       Name:  [name], [name]   MR#:  [id number]   DOB:  [birth year]   Account #:  [id number]    Surgery Date:  12/21/2015   Date of Adm:  12/21/2015       PREOPERATIVE DIAGNOSES   1. L5-S1 disk herniation on the right.   2. Sciatica.   3. Lumbago.    POSTOPERATIVE DIAGNOSES   1. L5-S1 disk herniation on the right.   2. Sciatica.   3. Lumbago.    PROCEDURES PERFORMED   1. L5-S1 microdiskectomy.   2. Use of operative microscope.    SURGEON: Theodis SatoAbilio Tareka Jhaveri, MD    FIRST ASSISTANT: Consuello ClossMatthew P. Carleton, PA-C    ESTIMATED BLOOD LOSS: Less than 10 mL.    COMPLICATIONS: None.    SPECIMENS REMOVED: None.    ANESTHESIA: General.     DRAINS: None.     INDICATIONS FOR PROCEDURE: The patient is a 40 year old lady   with an L5-S1 disk herniation, resulting in lumbago and sciatica that   has failed to improve with nonoperative treatment. At this point, she   would like to proceed with surgical intervention. I have given her   warnings about the possible complications including but not limited to   pain, scar, bleeding, infection, nonunion, damage to surrounding   structures, death, paralysis, blindness, stroke. She understands and   wants to proceed.    DESCRIPTION OF PROCEDURE: Informed consent was obtained   and the operative site was properly marked. The patient was moved   back to the operating room and underwent general endotracheal   anesthesia, was positioned prone on operating table, using Jean RosenthalJackson   table 4 poster frame. Arms were placed in a 90/90 position. The knees   were gently bent with pillows. Fluoroscopy was used to mark the level   of the incision and proceeded to prep and drape in the usual manner.   Time-out was obtained, verifying that this was the correct patient, the   correct surgery, the correct site, as well as that she had received IV   antibiotics within 30 minutes of the incision.       I then proceeded to perform my standard posterior approach to the   lumbar spine, exposing the area between L5 and S1. Once the area   was exposed on the right side, I proceeded to verify with fluoroscopy   that we were in the correct level. I then proceeded to bring in the   operative microscope and keep it in place for the remainder of the   procedure. A Midas Rex was used to perform a U cut laminectomy at   L5 and the bone was removed with a pituitary. The ligamentum flavum   was detached from superior leading edge with a curved curette and   flipped into the bony defect. Kerrison #3 was used to remove   ligamentum flavum and the medial overhang of the facet, freeing the   thecal sac. I was able then to gently retract the thecal sac with a   Penfield 4 and find a disk herniation that was subligamentous. Once   the retractor was in place, I proceeded to use a 15 blade to perform a   linear annulotomy and remove the free fragments with a micropituitary.   The annular defect was found and was explored and irrigated  with   normal saline until no further free fragments were present. Once the   area was fully decompressed, I irrigated the wound using normal   saline, verified that hemostasis was obtained and I closed the fascia   with #1 Vicryl figure-of-eight interrupted sutures, irrigated the subcu,   closed subcu with 2-0 Vicryl, the skin with a 3-0 running Monocryl.   Dermabond was applied. The patient was then awakened, transferred   to the PACU in stable condition.    POSTOPERATIVE PLAN: The patient is going to remain here   overnight. We are going to give her SCDs and TED hose for DVT   prophylaxis and Ancef for infection prophylaxis.        Theodis SatoABILIO [name] Mandell, MD      AR / SB   D:  12/21/2015   15:56   T:  12/22/2015   01:46   Job #:  [id number]

## 2015-12-22 NOTE — Progress Notes (Signed)
ORTHO POST OP SPINE PROGRESS NOTE    December 22, 2015  Admit Date: 12/21/2015  Admit Diagnosis: LUMBAGO HNP  RADICULOPATHY   HNP (herniated nucleus pulposus), lumbar  Procedure: Procedure(s):  RIGHT L5-S1 DISCECTOMY  Post Op day: 1 Day Post-Op    Subjective:     [name] Mclean is a patient who has complaints of minor lbp, no LE pain s/P L5-S1 microdiscectomy. tolerating po and able to void. .     Review of Systems: Pertinent items are noted in HPI.    Objective:     PT/OT:   Distance Ambulated:           Time Ambulated (min):        Ambulation Response:     Activity Response: Tolerated well  Assistive Device:              Assistive Device: Fall prevention device    Vital Signs:    Blood pressure 132/68, pulse 88, temperature 98.5 ??F (36.9 ??C), resp. rate 18, height 5\' 9"  (1.753 m), weight 100.5 kg (221 lb 9 oz), last menstrual period 12/07/2015, SpO2 97 %.    Temp (24hrs), Avg:98.2 ??F (36.8 ??C), Min:97.6 ??F (36.4 ??C), Max:98.8 ??F (37.1 ??C)         11/13 1901 - 11/15 0700  In: 2200 [P.O.:900; I.V.:1300]  Out: 3510 [Urine:3500]    LAB:    No results for input(s): HGB, HGBEXT, WBC, PLT, PLTEXT, HGBEXT, PLTEXT in the last 72 hours.    Wound/Drain Assessment:  Drain:      Dressing:     Physical Exam:  Neurological: no deficit  Incision clean, dry, and intact  5/5 BLE    Assessment:      Patient Active Problem List   Diagnosis Code   ??? HNP (herniated nucleus pulposus), lumbar M51.26       Plan:     Continue PT/OT/Rehab  Discontinue: IV  Consult: PT  and OT    Discharge To: home. today       Signed By: Jeralyn BennettMatthew P Mallorey Odonell, PA

## 2015-12-22 NOTE — Discharge Summary (Signed)
Spine Discharge Summary    Patient ID:  [name] Mclean  [id number]  female  40 y.o.  April 25, 1975    Admit date: 12/21/2015    Discharge date: 12/22/2015    Admitting Physician: Job FoundsAbilio A Reis, MD     Consulting Physician(s):   Treatment Team: Attending Provider: Job FoundsAbilio A Reis, MD; Utilization Review: Zenaida Nieceynthia G Flynn    Date of Surgery:   12/21/2015     Preoperative Diagnosis:  LUMBAGO  HNP   RADICULOPATHY     Postoperative Diagnosis:   lumbago, herniated nucleus pulposus    Procedure(s):  RIGHT L5-S1 DISCECTOMY     Anesthesia Type:   General     Surgeon: Job FoundsAbilio A Reis, MD                            HPI:  Pt is a 40 y.o. female who has a history of LUMBAGO  HNP   RADICULOPATHY   with pain and limitations of activities of daily living who presents at this time for a R. L5-S1 Discectomy following the failure of conservative management.    PMH:   Past Medical History:   Diagnosis Date   ??? Hypertension        Body mass index is 32.72 kg/(m^2). BMI greater than 30 is classified as obesity.     Medications upon admission :   Prior to Admission Medications   Prescriptions Last Dose Informant Patient Reported? Taking?   CYCLOBENZAPRINE HCL (FLEXERIL PO) 12/20/2015 at 0800  Yes Yes   Sig: Take  by mouth as needed.   cholecalciferol, vitamin D3, (VITAMIN D3) 2,000 unit tab 12/20/2015 at 1500  Yes Yes   Sig: Take 2,000 Int'l Units by mouth daily. Indications: Vitamin D Deficiency   gabapentin (NEURONTIN) 100 mg capsule   Yes Yes   Sig: Take 100 mg by mouth three (3) times daily.   hydroCHLOROthiazide (HYDRODIURIL) 25 mg tablet 12/20/2015 at 0800  Yes Yes   Sig: Take 25 mg by mouth every morning.   ibuprofen (MOTRIN) 800 mg tablet   Yes Yes   Sig: Take  by mouth three (3) times daily. Hold for two weeks.      Facility-Administered Medications: None        Allergies:  No Known Allergies     Hospital Course:  The patient underwent surgery. Intra-operative complications: None; patient tolerated the procedure well. Was taken to  the PACU in stable condition and then transferred to the ortho floor.      Perioperative Antibiotics:  Ancef     Postoperative Pain Management:  Oxycodone    Postoperative transfusions:    Number of units banked PRBCs =   none     Post Op complications: none    Hemoglobin at discharge:    Lab Results   Component Value Date/Time    HGB 11.9 12/16/2015 12:06 PM    INR 1.0 12/16/2015 12:06 PM       Dressing  - clean, dry and intact. No significant erythema or swelling. Neurovascular exam found to be within normal limits. Wound appears to be healing without any evidence of infection.     Patient mobilized with nursing and was found to be safe and steady with ambulation.     Discharged to: Home    Condition on Discharge:   stable    Discharge instructions:    - Take pain medications as prescribed  - Resume pre hospital diet      -  Discharge activity: activity as tolerated  - Ambulate as tolerated  - Wound Care Keep wound clean and dry.  See discharge instruction sheet.  - Staples, if present, to be removed 10-14 days after surgery            -DISCHARGE MEDICATION LIST     Current Discharge Medication List      START taking these medications    Details   acetaminophen (TYLENOL) 500 mg tablet Take 2 Tabs by mouth every six (6) hours for 14 days.  Qty: 112 Tab, Refills: 0      oxyCODONE IR (ROXICODONE) 5 mg immediate release tablet Take 1-2 Tabs by mouth every three (3) hours as needed. Max Daily Amount: 80 mg.  Qty: 80 Tab, Refills: 0      polyethylene glycol (MIRALAX) 17 gram/dose powder Take 17 g by mouth daily for 15 days.  Qty: 255 g, Refills: 0      senna-docusate (SENNA PLUS) 8.6-50 mg per tablet Take 1 Tab by mouth two (2) times a day.  Qty: 60 Tab, Refills: 0         CONTINUE these medications which have CHANGED    Details   ibuprofen (MOTRIN) 800 mg tablet Take  by mouth three (3) times daily. Hold for two weeks.         CONTINUE these medications which have NOT CHANGED    Details    gabapentin (NEURONTIN) 100 mg capsule Take 100 mg by mouth three (3) times daily.      cholecalciferol, vitamin D3, (VITAMIN D3) 2,000 unit tab Take 2,000 Int'l Units by mouth daily. Indications: Vitamin D Deficiency      hydroCHLOROthiazide (HYDRODIURIL) 25 mg tablet Take 25 mg by mouth every morning.      CYCLOBENZAPRINE HCL (FLEXERIL PO) Take  by mouth as needed.          per medical continuation form      -Follow up in office in 2 weeks      Signed:  Britta MccreedyAmanda N. Black  MSN, APRN, NP-C,  [facility]  Orthopaedic Nurse Practitioner    12/22/2015  2:03 PM

## 2015-12-22 NOTE — Other (Signed)
Patient ambulated with nursing. Patient tolerated activity well and ambulated the length of the hall without difficulty. No SOB, numbness, tingling, or pain reported.

## 2015-12-22 NOTE — Progress Notes (Signed)
Spiritual Care Partner Volunteer visited patient in Ortho on December 22, 2015.  Documented by:  Geri Jones, MPS, BCC, Chaplain  [facility]    Chaplain Paging Service  [phone]

## 2015-12-22 NOTE — Other (Incomplete)
I have reviewed discharge instructions with the patient and spouse.  The patient and spouse verbalized understanding.

## 2015-12-22 NOTE — Discharge Summary (Signed)
Spine Discharge Summary    Patient ID:  [name] Mclean  [id number]  female  40 y.o.  09-Nov-1975    Admit date: 12/21/2015    Discharge date: 12/22/2015    Admitting Physician: Job FoundsAbilio A Reis, MD     Consulting Physician(s):   Treatment Team: Attending Provider: Job FoundsAbilio A Reis, MD; Utilization Review: Zenaida Nieceynthia G Flynn    Date of Surgery:   12/21/2015     Preoperative Diagnosis:  LUMBAGO  HNP   RADICULOPATHY     Postoperative Diagnosis:   lumbago, herniated nucleus pulposus    Procedure(s):  RIGHT L5-S1 DISCECTOMY     Anesthesia Type:   General     Surgeon: Job FoundsAbilio A Reis, MD                            HPI:  Pt is a 40 y.o. female who has a history of LUMBAGO  HNP   RADICULOPATHY   with pain and limitations of activities of daily living who presents at this time for a R. L5-S1 Discectomy following the failure of conservative management.    PMH:   Past Medical History:   Diagnosis Date   ??? Hypertension        Body mass index is 32.72 kg/(m^2). BMI greater than 30 is classified as obesity.     Medications upon admission :   Prior to Admission Medications   Prescriptions Last Dose Informant Patient Reported? Taking?   CYCLOBENZAPRINE HCL (FLEXERIL PO) 12/20/2015 at 0800  Yes Yes   Sig: Take  by mouth as needed.   cholecalciferol, vitamin D3, (VITAMIN D3) 2,000 unit tab 12/20/2015 at 1500  Yes Yes   Sig: Take 2,000 Int'l Units by mouth daily. Indications: Vitamin D Deficiency   gabapentin (NEURONTIN) 100 mg capsule   Yes Yes   Sig: Take 100 mg by mouth three (3) times daily.   hydroCHLOROthiazide (HYDRODIURIL) 25 mg tablet 12/20/2015 at 0800  Yes Yes   Sig: Take 25 mg by mouth every morning.   ibuprofen (MOTRIN) 800 mg tablet   Yes Yes   Sig: Take  by mouth three (3) times daily. Hold for two weeks.      Facility-Administered Medications: None        Allergies:  No Known Allergies     Hospital Course:  The patient underwent surgery. Intra-operative complications: None; patient tolerated the procedure well. Was taken to the  PACU in stable condition and then transferred to the ortho floor.      Perioperative Antibiotics:  Ancef     Postoperative Pain Management:  Oxycodone    Postoperative transfusions:    Number of units banked PRBCs =   none     Post Op complications: none    Hemoglobin at discharge:    Lab Results   Component Value Date/Time    HGB 11.9 12/16/2015 12:06 PM    INR 1.0 12/16/2015 12:06 PM       Dressing  - clean, dry and intact. No significant erythema or swelling. Neurovascular exam found to be within normal limits. Wound appears to be healing without any evidence of infection.     Patient mobilized with nursing and was found to be safe and steady with ambulation.     Discharged to: Home    Condition on Discharge:   stable    Discharge instructions:    - Take pain medications as prescribed  - Resume pre hospital diet      -  Discharge activity: activity as tolerated  - Ambulate as tolerated  - Wound Care Keep wound clean and dry.  See discharge instruction sheet.  - Staples, if present, to be removed 10-14 days after surgery            -DISCHARGE MEDICATION LIST     Current Discharge Medication List      START taking these medications    Details   acetaminophen (TYLENOL) 500 mg tablet Take 2 Tabs by mouth every six (6) hours for 14 days.  Qty: 112 Tab, Refills: 0      oxyCODONE IR (ROXICODONE) 5 mg immediate release tablet Take 1-2 Tabs by mouth every three (3) hours as needed. Max Daily Amount: 80 mg.  Qty: 80 Tab, Refills: 0      polyethylene glycol (MIRALAX) 17 gram/dose powder Take 17 g by mouth daily for 15 days.  Qty: 255 g, Refills: 0      senna-docusate (SENNA PLUS) 8.6-50 mg per tablet Take 1 Tab by mouth two (2) times a day.  Qty: 60 Tab, Refills: 0         CONTINUE these medications which have CHANGED    Details   ibuprofen (MOTRIN) 800 mg tablet Take  by mouth three (3) times daily. Hold for two weeks.         CONTINUE these medications which have NOT CHANGED    Details   gabapentin (NEURONTIN) 100 mg  capsule Take 100 mg by mouth three (3) times daily.      cholecalciferol, vitamin D3, (VITAMIN D3) 2,000 unit tab Take 2,000 Int'l Units by mouth daily. Indications: Vitamin D Deficiency      hydroCHLOROthiazide (HYDRODIURIL) 25 mg tablet Take 25 mg by mouth every morning.      CYCLOBENZAPRINE HCL (FLEXERIL PO) Take  by mouth as needed.          per medical continuation form      -Follow up in office in 2 weeks      Signed:  Britta MccreedyAmanda N. Black  MSN, APRN, NP-C, [facility]  Orthopaedic Nurse Practitioner    12/22/2015  2:03 PM

## 2016-02-14 ENCOUNTER — Inpatient Hospital Stay: Admit: 2016-02-14 | Payer: PRIVATE HEALTH INSURANCE | Primary: Internal Medicine

## 2016-02-14 DIAGNOSIS — M5126 Other intervertebral disc displacement, lumbar region: Secondary | ICD-10-CM

## 2016-02-14 NOTE — Progress Notes (Signed)
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  She had relief with medication, but nothing else.  She did do an episode of PT for 1 month, but had to stop secondary to severe pain.  She had an L5-S1 microdiscectomy on 12/21/15.  She states that she had complete relief for 1-2 weeks after the surgery, but the pain has gotten progressively worse and returned to her pre-surgical pain. Her pain is now in the lower back (right > left), but she only has occasional radicular pain in the posterior right calf and stops proximal to the ankle. She states that the right LE feels unstable on occasion when she stands. Walking > 1 block, standing > 20 minutes, bending forward, twisting, and sitting > 30 minutes aggravate her pain.  Lying on her back and medication help to reduce her pain. She is sleeping well at night; she is sleeping of her back or her right side, but has pain when she moves between position. She is independent with her ADLs, but has more pain and discomfort. She lives in a 1-story home with 4 stairs to enter; she reports that she unable to navigate stairs without using the handrail  secondary to weakness. She works as a Conservation officer, nature at [employer] and has been out of work since September 2017; she works 30-35hrs per week with 5hr shifts. At work she has a 15 minute break after 2 hrs and then returns and stands until the end of the 5hr shift. PMH; Left knee cortisone injections in 2016 due to knee pain (prior to her back pain), injection in her back in 2016, HTN. She is taking Gabapentin and flexeril as prescribed and ibuprofen PRN for pain.    OBJECTIVE/EXAMINATION  Posture:  Mild forward head and protracted shoulders bilaterally; slouching in sitting  Other Observations:  Incision is well healing with no visible signs of infection  Gait and Functional Mobility:  Antalgic, Compensated Trendelenburg bilaterally (right > left)    Lumbar ROM:   Flexion: Severe, p!!   Extension: Moderate, p!!   Side Bending: Right: WFL   Left: WFL   Rotation: Right: Mild    Left: Mild    Balance Assessment: Moderate deficits in balance and neuromuscular control in single limb stance on right    Squat Assessment:   Double Leg Deviate to the left; only able to move 30% of available ROM   Single Leg NT    Neurological: Sensations: Intact to light touch sensation L1-S1 bilaterally    Flexibility: Moderate restrictions in hamstrings, hip internal and external rotators, quadriceps, iliopsoas, and gastrocnemius/soleus complex bilaterally     Right Knee:  AROM  WFL   Left  Knee:  AROM WFL     LOWER QUARTER   MUSCLE STRENGTH  KEY       R  L  0 - No Contraction  Hip flex  4  4  1  - Trace          er    4-  4  2 - Poor          ir   4  4  3  - Fair           abd  4-  4  4 - Good          ext  3+  4-  5 - Normal   Knee flex  5  5               Ext  4+  5      Ankle DF  5  5                PF  5  5        Gluteal activation: The Pt has improper gluteal activation with hip extension bilaterally     Joint Mobility Assessment: Decreased thoracic spinal mobility  Palpation: TTP and increased turgor along lumbar paraspinals and QL  bilaterally (right > left); TTP along right glut med    Special Tests:Varus: NT     SLR: Neg B    Valgus: NT     Slump: Neg B    McMurray's: NT    Thomas: Positive B    Anterior Drawer: NT    Obers: NT    Posterior Drawer: NT    Clonus: Neg B    Lachman's: NT    FABERS: Neg B      Modality rationale: decrease edema, decrease inflammation and decrease pain to improve the patient???s ability to perform ADLs with less pain or discomfort.   Min Type Additional Details   15  Estim: Att   Unatt        TENS instruct                  IFC  Premod   NMES                     Other:  w/US   w/ice   w/heat  Position: Supine  Location: Lower back      Traction:  Cervical       Lumbar                        Prone          Supine                       Intermittent   Continuous Lbs:   before manual   after manual  w/heat      Ultrasound: Continuous    Pulsed at:                            1MHz   3MHz Location:  W/cm2:      Paraffin         Location:  w/heat      Ice       Heat    Ice massage Position:  Location:      Laser    Other: Position:  Location:      Vasopneumatic  Device Pressure:        lo  med  hi   Temperature:     Skin assessment post-treatment:  intact redness- no adverse reaction    redness ??? adverse reaction:     15 min Therapeutic Exercise:   See flow sheet :   Rationale: increase ROM, increase strength, improve coordination, improve balance and increase proprioception to improve the patient???s ability to perform ADLs with less pain or discomfort.    With    TE    TA    neuro    other: Patient Education:  Review HEP     Progressed/Changed HEP based on:    positioning    body mechanics    transfers    heat/ice application     other: Pt educated on diagnosis and prognosis with therapy        Other Objective/Functional Measures: FOTO 41/100    Pain Level (0-10 scale) post treatment:  6/10      ASSESSMENT:        See Plan of Care      Levon Hedger, PT 02/14/2016  9:35 AM

## 2016-02-14 NOTE — Progress Notes (Signed)
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  Assessment/ key information: The Pt is a pleasant 41 year old female who presents to therapy with LBP following L5-S1 discectomy on 12/21/15.  The Pt complained of right-sided radiculopathy prior to the surgery and reports that since the surgery her incidence of radiculopathy is very rare.  The Pt currently presents with decreased hip strength and stability, decreased gluteal activation with dynamic activities, decreased core strength and stability, restrictions in her lower back and hip musculature, decreased lumbar and thoracic spinal mobility, gait impairments, and decreased activity tolerance and endurance. The patient would benefit from skilled physical therapy to help improve the above listed impairments to allow the patient to safely return to their prior level of function with less overall pain or risk of further injury.  The patient has a fair prognosis with skilled physical therapy.      Evaluation Complexity History MEDIUM  Complexity : 1-2 comorbidities / personal factors will impact the outcome/ POC ; Examination HIGH Complexity : 4+ Standardized tests and measures addressing body structure, function, activity limitation and / or participation in recreation  ;Presentation MEDIUM Complexity : Evolving with changing characteristics  ;Clinical Decision Making MEDIUM Complexity : FOTO score of 26-74  Overall Complexity Rating: MEDIUM    Problem List: pain affecting function, decrease ROM, decrease strength, impaired gait/ balance, decrease ADL/ functional abilitiies, decrease activity tolerance, decrease flexibility/ joint mobility and decrease transfer abilities   Treatment Plan may include any combination of the following: Therapeutic exercise, Therapeutic activities, Neuromuscular re-education, Physical agent/modality, Gait/balance training, Manual therapy, Patient education, Self Care training, Functional mobility training, Home safety training and Stair training  Patient / Family readiness to learn indicated by: asking questions and interest  Persons(s) to be included in education: patient (P)  Barriers to Learning/Limitations: None  Patient Goal (s): ???reduce pain???  Patient Self Reported Health Status: good  Rehabilitation Potential: fair    Short Term Goals: To be accomplished in 6 treatments:  1. The Pt will be independent and compliant with her HEP.  2. The Pt will report a 50% reduction in pain.  Long Term Goals: To be accomplished in 12 treatments:  1. The Pt will score the MCII on her FOTO survey demonstrating improved overall function (41 to 50 points).  2. The Pt will be able to stand >/= 3 hrs with 0-2/10 pain to allow the Pt to be able to perform work duties with less pain or discomfort.  3. The Pt will be able to sit >/= 1 hr  with 0-2/10 pain to allow the Pt to be able to drive with less pain or discomfort.  4. The Pt will be able to walk >/= 30 minutes with 0-2/10 pain to allow  the Pt to run errands with less pain or discomfort.    Frequency / Duration: Patient to be seen 1-2 times per week for 12 treatments.    Patient/ Caregiver education and instruction: self care, activity modification and exercises      Plan of care has been reviewed with PTA    Levon HedgerLauren S Latacha Texeira, PT 02/14/2016 2:23 PM    ________________________________________________________________________    I certify that the above Therapy Services are being furnished while the patient is under my care. I agree with the treatment plan and certify that this therapy is necessary.    Physician's Signature:____________________  Date:____________Time: _________

## 2016-02-16 ENCOUNTER — Inpatient Hospital Stay: Admit: 2016-02-16 | Payer: PRIVATE HEALTH INSURANCE | Primary: Internal Medicine

## 2016-02-16 NOTE — Progress Notes (Signed)
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  She has only been doing one of her exercises (pelvic tilts) at home.    OBJECTIVE    Modality rationale: decrease edema, decrease inflammation and decrease pain to improve the patient???s ability to perform ADLs with less pain or discomfort.   Min Type Additional Details   15  Estim: Att   Unatt        TENS instruct                  IFC  Premod   NMES                     Other:  w/US   w/ice   w/heat  Position: Supine  Location: Lower back      Traction:  Cervical       Lumbar                        Prone          Supine                       Intermittent   Continuous Lbs:   before manual   after manual  w/heat      Ultrasound: Continuous    Pulsed at:                              Location:  W/cm2:      Paraffin         Location:  w/heat      Ice       Heat    Ice massage Position:  Location:      Laser    Other: Position:  Location:      Vasopneumatic Device Pressure:        lo  med  hi   Temperature:     Skin assessment post-treatment:  intact redness- no adverse reaction    redness ??? adverse reaction:      24 min Therapeutic Exercise:   See flow sheet :   Rationale: increase ROM, increase strength, improve coordination, improve balance and increase proprioception to improve the patient???s ability to perform ADLs with less pain or discomfort.    20 min Manual Therapy:  STM and trigger point release to lumbar paraspinals and QL bilaterally   Rationale: decrease pain, increase ROM, increase tissue extensibility and decrease trigger points  to improve the patient???s ability to perform ADLs with less pain  or discomfort.    With    TE    TA    neuro    other: Patient Education:  Review HEP     Progressed/Changed HEP based on:    positioning    body mechanics    transfers    heat/ice application     other:      Other Objective/Functional Measures: None noted     Pain Level (0-10 scale) post treatment: 5.5/10    ASSESSMENT/Changes in Function:   The Pt required max cuing to properly perform her exercises.  She had to be stopped frequently because she was demonstrating pain through her facial features, but never verbally complaining of the pain.  She was educated on the importance of communicating what she is feeling with the exercises to make sure that the exercises are not detrimental to her improvement.  Will attempt to progress as tolerated during next PT session.  Patient will continue to benefit from skilled PT services to modify and progress therapeutic interventions, address functional mobility deficits, address ROM deficits, address strength deficits, analyze and address soft tissue restrictions, analyze and cue movement patterns, analyze and modify body mechanics/ergonomics, assess and modify postural abnormalities and instruct in home and community integration to attain remaining goals.       See Plan of Care    See progress note/recertification    See Discharge Summary         Progress towards goals / Updated goals:  Short Term Goals: To be accomplished in 6 treatments:   1. The Pt will be independent and compliant with her HEP- progressing  2. The Pt will report a 50% reduction in pain- progressing  Long Term Goals: To be accomplished in 12 treatments:  1. The Pt will score the MCII on her FOTO survey demonstrating improved overall function (41 to 50 points)- progressing  2. The Pt will be able to stand >/= 3 hrs with 0-2/10 pain to allow the Pt to be able to perform work duties with less pain or discomfort- progressing  3. The Pt will be able to sit >/= 1 hr with 0-2/10 pain to allow the Pt to be able to drive with less pain or discomfort- progressing  4. The Pt will be able to walk >/= 30 minutes with 0-2/10 pain to allow the Pt to run errands with less pain or discomfort- progressing  ??  PLAN    Upgrade activities as tolerated       Continue plan of care    Update interventions per flow sheet         Discharge due to:_    Other:_      Levon HedgerLauren S Bilbo Carcamo, PT 02/16/2016  10:48 AM

## 2016-02-25 ENCOUNTER — Inpatient Hospital Stay: Admit: 2016-02-25 | Payer: PRIVATE HEALTH INSURANCE | Primary: Internal Medicine

## 2016-02-25 NOTE — Progress Notes (Addendum)
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  She also reports pain in her R hip, but no significant pain in her lower back.    OBJECTIVE    Modality rationale: decrease edema, decrease inflammation, decrease pain and increase tissue extensibility to improve the patient???s ability to perform ADLs with decreased pain or discomfort.    Min Type Additional Details   15  Estim: Att   Unatt        TENS instruct                  IFC  Premod   NMES                     Other:  w/US   w/ice   w/heat  Position: Supine  Location: Lower back      Traction:  Cervical       Lumbar                        Prone          Supine                       Intermittent   Continuous Lbs:   before manual   after manual  w/heat      Ultrasound: Continuous    Pulsed at:                              Location:  W/cm2:      Paraffin         Location:  w/heat      Ice       Heat    Ice massage Position:  Location:      Laser    Other: Position:  Location:      Vasopneumatic Device Pressure:        lo  med  hi   Temperature:     Skin assessment post-treatment:  intact redness- no adverse reaction     redness ??? adverse reaction:     47 min Therapeutic Exercise:   See flow sheet :   Rationale: increase ROM, increase strength, improve coordination and increase proprioception to improve the patient???s ability to perform ADLs with decreased pain or discomfort.    19 min Manual Therapy:  STM of lumbar paraspinals and QL on R side   Rationale: decrease pain, increase ROM, increase tissue extensibility and decrease trigger points  to improve the patient???s ability to perform ADLs with decreased pain or discomfort.  With    TE    TA    neuro    other: Patient Education:  Review HEP     Progressed/Changed HEP based on:    positioning    body mechanics    transfers    heat/ice application     other:      Other Objective/Functional Measures: Calf circumference measurements (secondary to complaints of L calf pain and reports of L ankle swelling): L 39.3 cm R 38.1 cm  Based on palpation and minimal difference in circumference measurements, a DVT is not assumed to be present.     Pain Level (0-10 scale) post treatment: 0/10    ASSESSMENT/Changes in Function:   Patient performed stretches and exercises today with mod-severe complaints of pain in low back bilaterally (R>L) and occasionally L calf that limited her ability to complete exercises and prolonged therapy session. Added a calf stretch to decrease tightness in gastroc-soleus complex as well as some simple exercises to continue to promote increased mobility in lower back and strength in hips.  A DVT is not assumed to be present, but pain and tightness caused secondary to increased use of L LE with functional mobility. Tolerated manual therapy techniques with minimal complaints of pain; stated she "feels like my back just needs to be popped or stretched out."    Patient will continue to benefit from skilled PT services to modify and progress therapeutic interventions, address functional mobility deficits,  address ROM deficits, address strength deficits, analyze and address soft tissue restrictions, analyze and cue movement patterns, analyze and modify body mechanics/ergonomics, assess and modify postural abnormalities and instruct in home and community integration to attain remaining goals.       See Plan of Care    See progress note/recertification    See Discharge Summary         Progress towards goals / Updated goals:  Short Term Goals:??To be accomplished in 6??treatments:  1. The Pt will be independent and compliant with her HEP- progressing  2. The Pt will report a 50% reduction in pain- progressing  Long Term Goals:??To be accomplished in 12??treatments:  1. The Pt will score the MCII on her FOTO survey demonstrating improved overall function (41 to 50 points)- progressing  2. The Pt will be able to stand >/= 3 hrs with 0-2/10 pain to allow the Pt to be able to perform work duties with less pain or discomfort- progressing  3. The Pt will be able to sit >/= 1 hr with 0-2/10 pain to allow the Pt to be able to drive with less pain or discomfort- progressing  4. The Pt will be able to walk >/= 30 minutes with 0-2/10 pain to allow the Pt to run errands with less pain or discomfort- progressing    PLAN    Upgrade activities as tolerated       Continue plan of care    Update interventions per flow sheet         Discharge due to:_    Other:_      [name] 02/25/2016  11:38 AM

## 2016-03-03 ENCOUNTER — Inpatient Hospital Stay: Admit: 2016-03-03 | Payer: PRIVATE HEALTH INSURANCE | Primary: Internal Medicine

## 2016-03-03 NOTE — Progress Notes (Addendum)
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 She denies numbness/tingling, but moreso "pain" down whole left leg with no specific pattern.  She has not performed her exercises at home, and spends most of the time lying down or sitting.    OBJECTIVE    Modality rationale: decrease edema, decrease inflammation, decrease pain and increase tissue extensibility to improve the patient???s ability to perform ADLs with decreased pain or discomfort.   Min Type Additional Details   15  Estim: Att   Unatt        TENS instruct                  IFC  Premod   NMES                     Other:  w/US   w/ice   w/heat  Position: Supine  Location: Lower back bilaterally      Traction:  Cervical       Lumbar                        Prone          Supine                       Intermittent   Continuous Lbs:   before manual   after manual  w/heat      Ultrasound: Continuous    Pulsed at:                            1MHz   3MHz Location:  W/cm2:      Paraffin         Location:  w/heat      Ice       Heat    Ice massage Position:  Location:      Laser    Other: Position:  Location:      Vasopneumatic Device Pressure:        lo  med  hi   Temperature:      Skin assessment post-treatment:  intact redness- no adverse reaction    redness ??? adverse reaction:     30 min Therapeutic Exercise:   See flow sheet :   Rationale: increase ROM, increase strength, improve coordination and increase proprioception to improve the patient???s ability to perform ADLs with decreased pain or discomfort.    17 min Manual Therapy:  STM of lumbar paraspinals and QL on R side   Rationale: decrease pain, increase ROM, increase tissue extensibility, decrease edema  and decrease trigger points  to improve  the patient???s ability to perform ADLs with decreased pain or discomfort.          With    TE    TA    neuro    other: Patient Education:  Review HEP     Progressed/Changed HEP based on:    positioning    body mechanics    transfers    heat/ice application     other: Educated patient on improved rolling technique to decrease pain with getting out of bed.     Other Objective/Functional Measures: None noted     Pain Level (0-10 scale) post treatment: 6.5/10    ASSESSMENT/Changes in Function:   Patient was unable to tolerate L SKTC exercise due to intense L leg pain.  Patient complained of constant pain with exercises today and was reluctant to perform most exercises. She did note feeling somewhat better after session due to increased movement.  Patient will continue to benefit from skilled PT services to modify and progress therapeutic interventions, address functional mobility deficits, address ROM deficits, address strength deficits, analyze and address soft tissue restrictions, analyze and cue movement patterns, analyze and modify body mechanics/ergonomics, assess and modify postural abnormalities and instruct in home and community integration to attain remaining goals.       See Plan of Care    See progress note/recertification    See Discharge Summary         Progress towards goals / Updated goals:  Short Term Goals:??To be accomplished in 6??treatments:   1. The Pt will be independent and compliant with her HEP- progressing  2. The Pt will report a 50% reduction in pain- progressing  Long Term Goals:??To be accomplished in 12??treatments:  1. The Pt will score the MCII on her FOTO survey demonstrating improved overall function (41 to 50 points)- progressing  2. The Pt will be able to stand >/= 3 hrs with 0-2/10 pain to allow the Pt to be able to perform work duties with less pain or discomfort- progressing  3. The Pt will be able to sit >/= 1 hr with 0-2/10 pain to allow the Pt to be able to drive with less pain or discomfort- progressing  4. The Pt will be able to walk >/= 30 minutes with 0-2/10 pain to allow the Pt to run errands with less pain or discomfort- progressing    PLAN    Upgrade activities as tolerated       Continue plan of care    Update interventions per flow sheet         Discharge due to:_    Other:_      Dorna Mai 03/03/2016  10:25 AM

## 2016-03-08 NOTE — Progress Notes (Addendum)
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  She reported continued pain, but acknowledged only doing 1 of the HEP exercises occasionally.  Pt has a follow-up with Dr. Mammie Russianeis today to discuss L-sided pain.    OBJECTIVE         With    TE    TA    neuro    other: Patient Education:  Review HEP     Progressed/Changed HEP based on:    positioning    body mechanics    transfers    heat/ice application     other: Educated patient on the importance of being compliant and diligent with HEP in order to see true physical improvement.       Other Objective/Functional Measures: N/A     Pain Level (0-10 scale) post treatment: N/a    ASSESSMENT/Changes in Function:   Pt arrived to PT with flu-like symptoms and was instructed to go home in order to keep other patients safe and not exacerbate her symptoms.  We will continue usual treatment next session and continue progressing exercises appropriately.  Patient will continue to benefit from skilled PT services to modify and progress therapeutic interventions, address functional mobility deficits,  address ROM deficits, address strength deficits, analyze and address soft tissue restrictions, analyze and cue movement patterns, analyze and modify body mechanics/ergonomics, assess and modify postural abnormalities and instruct in home and community integration to attain remaining goals.       See Plan of Care    See progress note/recertification    See Discharge Summary         Progress towards goals / Updated goals:  Short Term Goals:??To be accomplished in 6??treatments:  1. The Pt will be independent and compliant with her HEP- progressing  2. The Pt will report a 50% reduction in pain- progressing  Long Term Goals:??To be accomplished in 12??treatments:  1. The Pt will score the MCII on her FOTO survey demonstrating improved overall function (41 to 50 points)- progressing  2. The Pt will be able to stand >/= 3 hrs with 0-2/10 pain to allow the Pt to be able to perform work duties with less pain or discomfort- progressing  3. The Pt will be able to sit >/= 1 hr with 0-2/10 pain to allow the Pt to be able to drive with less pain or discomfort- progressing  4. The Pt will be able to walk >/= 30 minutes with 0-2/10 pain to allow the Pt to run errands with less pain or discomfort- progressing    PLAN    Upgrade activities as tolerated       Continue plan of  care    Update interventions per flow sheet         Discharge due to:_    Other:_      Levon Hedger, PT 03/08/2016  11:06 AM

## 2016-03-09 ENCOUNTER — Inpatient Hospital Stay: Payer: PRIVATE HEALTH INSURANCE | Primary: Internal Medicine

## 2016-03-09 ENCOUNTER — Encounter

## 2016-03-10 ENCOUNTER — Inpatient Hospital Stay: Payer: PRIVATE HEALTH INSURANCE | Primary: Internal Medicine

## 2016-03-14 ENCOUNTER — Encounter: Payer: PRIVATE HEALTH INSURANCE | Primary: Internal Medicine

## 2016-03-16 ENCOUNTER — Inpatient Hospital Stay: Admit: 2016-03-16 | Payer: PRIVATE HEALTH INSURANCE | Primary: Internal Medicine

## 2016-03-16 DIAGNOSIS — M5126 Other intervertebral disc displacement, lumbar region: Secondary | ICD-10-CM

## 2016-03-16 NOTE — Progress Notes (Addendum)
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  She reports today is a pretty good day and feels better than she has this past week. She was diagnosed with strep throat last week.  She notes occasional weakness in L arm and numbness in L hand, dropping things because of weakened grip.      OBJECTIVE    61 min Therapeutic Exercise:   See flow sheet :   Rationale: increase ROM, increase strength, improve coordination and increase proprioception to improve the patient???s ability to perform ADLs with decreased pain or discomfort.    With    TE    TA    neuro    other: Patient Education:  Review HEP     Progressed/Changed HEP based on:    positioning    body mechanics    transfers    heat/ice application     other:      Other Objective/Functional Measures:   0-100: 48% improved  FOTO score: 37/100    Pain Level (0-10 scale) post treatment: 4.5/10    ASSESSMENT/Changes in Function:   Patient able to tolerate more with less discomfort today compared to previous sessions based on decreased facial grimaces and increased fluidity of movements.  She tolerated progressions and new exercises well,  noting correct spots of muscle fatigue, but no increased pain.  Patient will continue to benefit from skilled PT services to modify and progress therapeutic interventions, address functional mobility deficits, address ROM deficits, address strength deficits, analyze and address soft tissue restrictions, analyze and cue movement patterns, analyze and modify body mechanics/ergonomics, assess and modify postural abnormalities, address imbalance/dizziness and instruct in home and community integration to attain remaining goals.       See Plan of Care    See progress note/recertification    See Discharge Summary         Progress towards goals / Updated goals:  Short Term Goals:??To be accomplished in 6??treatments:  1. The Pt will be independent and compliant with her HEP- progressing  2. The Pt will report a 50% reduction in pain- progressing  Long Term Goals:??To be accomplished in 12??treatments:  1. The Pt will score the MCII on her FOTO survey demonstrating improved overall function (41 to 50 points)- progressing  2. The Pt will be able to stand >/= 3 hrs with 0-2/10 pain to allow the Pt to be able to perform work duties with less pain or discomfort- progressing  3. The Pt will be able to sit >/= 1 hr with 0-2/10 pain to allow the Pt to be able to drive with less pain or discomfort- progressing  4. The Pt will be able  to walk >/= 30 minutes with 0-2/10 pain to allow the Pt to run errands with less pain or discomfort- progressing    PLAN    Upgrade activities as tolerated       Continue plan of care    Update interventions per flow sheet         Discharge due to:_    Other:_      [name] 03/16/2016  12:01 PM

## 2016-03-16 NOTE — Progress Notes (Addendum)
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  Patient has been slowly progressing with therapy that has focused on increasing her hip and core strength and stability, improving the mobility of her lower back musculature, decreasing her lower back pain, reducing the turgor and restrictions in her lower back, improving her functional mobility, and increasing her activity tolerance and endurance via therapeutic exercise, manual therapy, and pain-relieving modalities.  The patient currently reports 48% improvement since beginning therapy and states that she currently experiences more "good days" in terms of pain relief than she had before initiating PT.  Her functional tolerance and endurance have improved somewhat based on performance of exercises in our sessions, however, she has had difficulty with compliance in performing her HEP consistently at home.  Recommend 7 additional treatment sessions in order to continue improving the above-listed impairments and allow the patient to fully return to her prior level of function with less overall pain or risk of further injury.  Thank you for this referral.    Progress towards goals / Updated goals:  Short Term Goals:??To be accomplished in 6??treatments:  1. The Pt will be independent and compliant with her HEP- progressing   2. The Pt will report a 50% reduction in pain- progressing  Long Term Goals:??To be accomplished in 12??treatments:  1. The Pt will score the MCII on her FOTO survey demonstrating improved overall function (41 to 50 points)- progressing  2. The Pt will be able to stand >/= 3 hrs with 0-2/10 pain to allow the Pt to be able to perform work duties with less pain or discomfort- progressing  3. The Pt will be able to sit >/= 1 hr with 0-2/10 pain to allow the Pt to be able to drive with less pain or discomfort- progressing  4. The Pt will be able to walk >/= 30 minutes with 0-2/10 pain to allow the Pt to run errands with less pain or discomfort- progressing        [name] 03/16/2016 1:14 PM    ________________________________________________________________________  NOTE TO PHYSICIAN:  Please complete the following and fax to:  [facility]: [phone]  . Retain this original for your records.  If you are unable to process this request in 24 hours, please contact our office.       ____ I have read the above report and request that my patient continue therapy with the following changes/special instructions:  ____ I have read the above report and request that my patient be discharged from therapy    Physician's Signature:_________________ Date:___________Time:__________

## 2016-03-18 ENCOUNTER — Ambulatory Visit: Payer: PRIVATE HEALTH INSURANCE | Primary: Internal Medicine

## 2016-03-21 ENCOUNTER — Encounter: Payer: PRIVATE HEALTH INSURANCE | Primary: Internal Medicine

## 2016-03-21 DIAGNOSIS — M5440 Lumbago with sciatica, unspecified side: Secondary | ICD-10-CM

## 2016-03-22 ENCOUNTER — Inpatient Hospital Stay: Payer: PRIVATE HEALTH INSURANCE | Attending: Orthopaedic Surgery | Primary: Internal Medicine

## 2016-03-23 ENCOUNTER — Encounter: Payer: PRIVATE HEALTH INSURANCE | Primary: Internal Medicine

## 2016-03-31 ENCOUNTER — Inpatient Hospital Stay: Payer: PRIVATE HEALTH INSURANCE | Primary: Internal Medicine

## 2016-04-01 ENCOUNTER — Inpatient Hospital Stay: Payer: PRIVATE HEALTH INSURANCE | Attending: Orthopaedic Surgery | Primary: Internal Medicine

## 2016-04-07 NOTE — Discharge Instructions (Signed)
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]  Other specified postprocedural states [Z98.890]  Sciatica, right side [M54.31]  Spondylosis without myelopathy or radiculopathy, lumbosacral region [M47.817]     Prior Hospitalization: see medical history     Comorbidities: See Plan of Care  Prior Level of Function:See Plan of Care  Medications: Verified on Patient Summary List    Start of Care:  02/14/16      Onset Date:L5/S1 discectomy on 12/21/15   Visits from Start of Care: 5     Missed Visits: No show 2, cancel 1  Reporting Period : 02/14/16 to 03/16/16      ASSESSMENT/SUMMARY OF CARE: Pt is discharged today, 04/07/2016, as they have stopped attending therapy.  Final objective data and outcomes were unable to be obtained.    RECOMMENDATIONS:  [x] Discontinue therapy: [] Patient has reached or is progressing toward set goals      [x] Patient is non-compliant or has abdicated      [] Due to lack of appreciable progress towards set goals      [] Other    Levon HedgerLauren S Thursa Emme, PT 04/07/2016 1:26 PM

## 2016-04-08 ENCOUNTER — Inpatient Hospital Stay: Payer: PRIVATE HEALTH INSURANCE | Primary: Internal Medicine

## 2016-04-10 ENCOUNTER — Encounter

## 2016-04-10 ENCOUNTER — Encounter: Payer: PRIVATE HEALTH INSURANCE | Primary: Internal Medicine

## 2016-04-12 ENCOUNTER — Encounter: Payer: PRIVATE HEALTH INSURANCE | Primary: Internal Medicine

## 2016-04-17 ENCOUNTER — Encounter: Payer: PRIVATE HEALTH INSURANCE | Primary: Internal Medicine

## 2016-04-19 ENCOUNTER — Encounter: Payer: PRIVATE HEALTH INSURANCE | Primary: Internal Medicine

## 2016-04-24 ENCOUNTER — Encounter: Payer: PRIVATE HEALTH INSURANCE | Primary: Internal Medicine

## 2016-04-26 ENCOUNTER — Encounter: Payer: PRIVATE HEALTH INSURANCE | Primary: Internal Medicine

## 2016-06-21 ENCOUNTER — Inpatient Hospital Stay: Admit: 2016-06-21 | Payer: Self-pay | Attending: Orthopaedic Surgery | Primary: Internal Medicine

## 2016-06-21 ENCOUNTER — Ambulatory Visit

## 2016-06-21 DIAGNOSIS — M47812 Spondylosis without myelopathy or radiculopathy, cervical region: Secondary | ICD-10-CM

## 2016-06-21 DIAGNOSIS — G8929 Other chronic pain: Secondary | ICD-10-CM

## 2016-06-21 LAB — CREATININE, POC
Creatinine (POC): 0.9 mg/dL (ref 0.6–1.3)
GFRAA, POC: >60 mL/min/{1.73_m2} (ref >60)
GFRNA, POC: >60 mL/min/{1.73_m2} (ref >60)

## 2016-06-21 MED ORDER — MIDAZOLAM 1 MG/ML IJ SOLN
1 mg/mL | Freq: Once | INTRAMUSCULAR | Status: AC
Start: 2016-06-21 — End: 2016-06-21
  Administered 2016-06-21: 15:00:00 via INTRAVENOUS

## 2016-06-21 MED ORDER — GADOTERATE MEGLUMINE 0.5 MMOL/ML IV SYRINGE
0.5 mmol/mL | Freq: Once | INTRAVENOUS | Status: AC
Start: 2016-06-21 — End: 2016-06-21
  Administered 2016-06-21: 15:00:00 via INTRAVENOUS

## 2016-06-21 MED ORDER — ALPRAZOLAM 0.5 MG TAB
0.5 mg | Freq: Once | ORAL | Status: AC
Start: 2016-06-21 — End: 2016-06-21
  Administered 2016-06-21: 14:00:00 via ORAL

## 2016-06-21 MED ORDER — SODIUM CHLORIDE 0.9 % IV
Freq: Once | INTRAVENOUS | Status: AC
Start: 2016-06-21 — End: 2016-06-21
  Administered 2016-06-21: 15:00:00 via INTRAVENOUS

## 2016-06-21 MED FILL — ALPRAZOLAM 0.5 MG TAB: 0.5 mg | ORAL | Qty: 1

## 2016-06-21 MED FILL — MIDAZOLAM 1 MG/ML IJ SOLN: 1 mg/mL | INTRAMUSCULAR | Qty: 5

## 2016-06-21 MED FILL — SODIUM CHLORIDE 0.9 % IV: INTRAVENOUS | Qty: 500

## 2016-06-21 MED FILL — DOTAREM 0.5 MMOL/ML INTRAVENOUS SYRINGE: 0.5 mmol/mL | INTRAVENOUS | Qty: 20

## 2016-06-21 NOTE — Progress Notes (Signed)
The patient was monitored by the pulse ox and nurse at side. She had continuous oxygen at 2 liters per nasal canula. She tolerated the mri well until the last ten minutes. I brought her out the scanner and gave her versed 0.5 mg iv and she was able to finish the exam [name] HampshirePatricia Annelisa Ryback RN

## 2016-06-21 NOTE — H&P (Signed)
Interventional Radiology History and Physical (Outpatient)    06/21/2016    Patient: [name] Mclean 41 y.o. female     Referring Physician:  Job Foundseis, Abilio A, MD    Chief Complaint: claustrophobia     History of Present Illness: needs mri    History:  Past Medical History:   Diagnosis Date   ??? Hypertension      Family History   Problem Relation Age of Onset   ??? Hypertension Mother    ??? Hypertension Father    ??? Diabetes Father    ??? Cancer Sister      breast   ??? Hypertension Brother    ??? Cancer Sister      lung     Social History     Social History   ??? Marital status: SINGLE     Spouse name: N/A   ??? Number of children: N/A   ??? Years of education: N/A     Occupational History   ??? Not on file.     Social History Main Topics   ??? Smoking status: Never Smoker   ??? Smokeless tobacco: Never Used   ??? Alcohol use Yes      Comment: social   ??? Drug use: Yes     Special: Prescription, OTC   ??? Sexual activity: Not on file     Other Topics Concern   ??? Not on file     Social History Narrative       Allergies: No Known Allergies    Prior to Admission Medications:  Prior to Admission medications    Medication Sig Start Date End Date Taking? Authorizing Provider   gabapentin (NEURONTIN) 100 mg capsule Take 100 mg by mouth three (3) times daily.    Historical Provider   ibuprofen (MOTRIN) 800 mg tablet Take  by mouth three (3) times daily. Hold for two weeks. 12/22/15   Britta MccreedyAmanda N. Black, NP   oxyCODONE IR (ROXICODONE) 5 mg immediate release tablet Take 1-2 Tabs by mouth every three (3) hours as needed. Max Daily Amount: 80 mg. 12/22/15   Britta MccreedyAmanda N. Black, NP   senna-docusate (SENNA PLUS) 8.6-50 mg per tablet Take 1 Tab by mouth two (2) times a day. 12/22/15   Britta MccreedyAmanda N. Black, NP   cholecalciferol, vitamin D3, (VITAMIN D3) 2,000 unit tab Take 2,000 Int'l Units by mouth daily. Indications: Vitamin D Deficiency    Historical Provider   hydroCHLOROthiazide (HYDRODIURIL) 25 mg tablet Take 25 mg by mouth every morning.    Historical Provider    CYCLOBENZAPRINE HCL (FLEXERIL PO) Take  by mouth as needed.    Historical Provider       Physical Exam:    There were no vitals taken for this visit.  General: alert, cooperative, no distress, appears stated age  Heart: normal S1 and S2  Lungs: clear to auscultation bilaterally    Plan of Care/Planned Procedure:  Risks, benefits, and alternatives reviewed with patient and she agrees to proceed with the procedure.     Jonelle SportsAustin E Pasco Marchitto, MD

## 2016-06-21 NOTE — Progress Notes (Signed)
The patient was sedated with xanax 0.5 mg po and versed 1.5 mg iv. She tolerated the mri well and was able to eat and drink after the procedure. She was transported in a wheelchair to her driver and written discharge instructions were given to her. A disc of her images was given to her. [name] HampshirePatricia Taniaya Rudder RN

## 2016-06-22 NOTE — Progress Notes (Signed)
Happy with the procedure and that it was completed. Micael HampshirePatricia Shuntavia Yerby RN

## 2017-03-22 ENCOUNTER — Encounter

## 2017-03-29 ENCOUNTER — Ambulatory Visit: Payer: PRIVATE HEALTH INSURANCE | Primary: Internal Medicine

## 2017-04-02 ENCOUNTER — Ambulatory Visit: Payer: PRIVATE HEALTH INSURANCE | Primary: Internal Medicine

## 2017-05-18 ENCOUNTER — Inpatient Hospital Stay: Payer: PRIVATE HEALTH INSURANCE | Attending: Orthopaedic Surgery | Primary: Internal Medicine

## 2017-08-31 ENCOUNTER — Encounter

## 2017-09-08 ENCOUNTER — Inpatient Hospital Stay: Admit: 2017-09-08 | Payer: PRIVATE HEALTH INSURANCE | Attending: Orthopaedic Surgery | Primary: Internal Medicine

## 2017-09-08 DIAGNOSIS — M5137 Other intervertebral disc degeneration, lumbosacral region: Secondary | ICD-10-CM

## 2017-09-08 MED ORDER — GADOTERATE MEGLUMINE 0.5 MMOL/ML IV SOLUTION
0.5 mmol/mL (376.9 mg/mL) | Freq: Once | INTRAVENOUS | Status: AC
Start: 2017-09-08 — End: 2017-09-08
  Administered 2017-09-08: 20:00:00 via INTRAVENOUS

## 2019-02-27 ENCOUNTER — Ambulatory Visit: Payer: Managed Care, Other (non HMO) | Attending: Internal Medicine

## 2019-02-27 DIAGNOSIS — Z20822 Contact with and (suspected) exposure to covid-19: Secondary | ICD-10-CM

## 2019-02-28 LAB — NOVEL CORONAVIRUS, NAA: SARS-CoV-2, NAA: DETECTED — AB

## 2020-03-05 ENCOUNTER — Other Ambulatory Visit: Payer: Self-pay

## 2020-03-05 ENCOUNTER — Encounter (HOSPITAL_COMMUNITY): Payer: Self-pay

## 2020-03-05 ENCOUNTER — Ambulatory Visit (HOSPITAL_COMMUNITY)
Admission: EM | Admit: 2020-03-05 | Discharge: 2020-03-05 | Disposition: A | Discharge status: Home / Self Care | Payer: 59 | Attending: Student | Admitting: Student

## 2020-03-05 DIAGNOSIS — H1131 Conjunctival hemorrhage, right eye: Secondary | ICD-10-CM | POA: Diagnosis not present

## 2020-03-05 DIAGNOSIS — H1031 Unspecified acute conjunctivitis, right eye: Secondary | ICD-10-CM

## 2020-03-05 DIAGNOSIS — I10 Essential (primary) hypertension: Secondary | ICD-10-CM

## 2020-03-05 DIAGNOSIS — Z8616 Personal history of COVID-19: Secondary | ICD-10-CM

## 2020-03-05 MED ORDER — TETRACAINE HCL 0.5 % OP SOLN
OPHTHALMIC | Status: AC
  Filled 2020-03-05: qty 4

## 2020-03-05 MED ORDER — LISINOPRIL-HYDROCHLOROTHIAZIDE 20-12.5 MG PO TABS: 1.0000 | ORAL_TABLET | Freq: Every day | ORAL | 2 refills | Status: DC

## 2020-03-05 MED ORDER — FLUORESCEIN SODIUM 1 MG OP STRP
ORAL_STRIP | OPHTHALMIC | Status: AC
  Filled 2020-03-05: qty 1

## 2020-03-05 MED ORDER — LOSARTAN POTASSIUM-HCTZ 50-12.5 MG PO TABS: 1.0000 | ORAL_TABLET | Freq: Every day | ORAL | 2 refills | Status: DC

## 2020-03-05 MED ORDER — EYE WASH OPHTH SOLN
OPHTHALMIC | Status: AC
  Filled 2020-03-05: qty 118

## 2020-03-05 MED ORDER — CIPROFLOXACIN HCL 0.3 % OP SOLN: 1.0000 [drp] | OPHTHALMIC | 0 refills | Status: AC

## 2020-03-05 NOTE — ED Provider Notes (Signed)
[facility]    CSN: [id number] Arrival date & time: 03/05/20  1046      History   Chief Complaint Chief Complaint  Patient presents with  . Eye Problem    Right eye     HPI Sara Erickson is a 45 y.o. female presenting with right eye problem, medication refill, and history of conjunctivitis.  -States she woke up this morning with right eye redness, crusting, blurred vision, burning, and photophobia. Denies trauma. Wears glasses, not contacts.  Deniesforeign body sensation, eye pain, eye pain with movement, injury to eye, double vision. -History of hypertension, off of BP medications for 2 months. occ headaches though not today.  -History of covid19 12 months ago, states her sense of taste/smell still hasn't returned.   HPI  Past Medical History:  Diagnosis Date  . Hypertension   . Murmur 09/17/2014  . Obesity (BMI 30.0-34.9) 09/17/2014    Patient Active Problem List   Diagnosis Date Noted  . Essential hypertension 09/17/2014  . Murmur 09/17/2014  . Obesity (BMI 30.0-34.9) 09/17/2014    History reviewed. No pertinent surgical history.  OB History   No obstetric history on file.      Home Medications    Prior to Admission medications   Medication Sig Start Date End Date Taking? Authorizing Provider  ciprofloxacin (CILOXAN) 0.3 % ophthalmic solution Place 1 drop into the right eye every 4 (four) hours while awake for 7 days. 03/05/20 03/12/20 Yes Rhys Martini, PA-C  losartan-hydrochlorothiazide (HYZAAR) 50-12.5 MG tablet Take 1 tablet by mouth daily. 03/05/20  Yes Rhys Martini, PA-C  lisinopril-hydrochlorothiazide (ZESTORETIC) 20-12.5 MG tablet Take 1 tablet by mouth daily. 03/05/20 03/05/20 Yes Rhys Martini, PA-C  ibuprofen (ADVIL,MOTRIN) 200 MG tablet Take 200 mg by mouth every 6 (six) hours as needed for headache, mild pain or moderate pain.    [provider]  meloxicam (MOBIC) 15 MG tablet Take 1 tablet (15 mg total) by mouth daily as  needed for pain. 04/09/15   Raeford Razor, MD    Family History Family History  Problem Relation Age of Onset  . Diabetes Mother   . Diabetes Father   . Hypertension Mother   . Hypertension Father   . Cancer Sister        breast  . Cancer Sister        lung    Social History Social History   Tobacco Use  . Smoking status: Never Smoker  . Smokeless tobacco: Never Used  Substance Use Topics  . Alcohol use: No  . Drug use: No     Allergies   Patient has no known allergies.   Review of Systems Review of Systems  Constitutional: Negative for appetite change, chills and fever.  HENT: Negative for congestion, ear pain, rhinorrhea, sinus pressure, sinus pain and sore throat.   Eyes: Positive for photophobia, discharge, redness, itching and visual disturbance. Negative for pain.  Respiratory: Negative for cough, chest tightness, shortness of breath and wheezing.   Cardiovascular: Negative for chest pain and palpitations.  Gastrointestinal: Negative for abdominal pain, constipation, diarrhea, nausea and vomiting.  Genitourinary: Negative for dysuria, frequency and urgency.  Musculoskeletal: Negative for myalgias.  Neurological: Negative for dizziness, weakness and headaches.  Psychiatric/Behavioral: Negative for confusion.  All other systems reviewed and are negative.    Physical Exam Triage Vital Signs ED Triage Vitals  Enc Vitals Group     BP 03/05/20 1112 (!) 159/87     Pulse Rate  03/05/20 1112 65     Resp 03/05/20 1112 16     Temp 03/05/20 1112 98.7 F (37.1 C)     Temp Source 03/05/20 1112 Oral     SpO2 03/05/20 1112 100 %     Weight --      Height --      Head Circumference --      Peak Flow --      Pain Score 03/05/20 1111 0     Pain Loc --      Pain Edu? --      Excl. in GC? --    No data found.  Updated Vital Signs BP (!) 159/87 (BP Location: Right Arm)   Pulse 65   Temp 98.7 F (37.1 C) (Oral)   Resp 16   LMP 02/26/2020   SpO2 100%    Visual Acuity Right Eye Distance: 20/70 Left Eye Distance: 20/20 Bilateral Distance: 20/20  Right Eye Near:   Left Eye Near:    Bilateral Near:     Physical Exam Vitals reviewed.  Constitutional:      General: She is not in acute distress.    Appearance: Normal appearance. She is not ill-appearing.  HENT:     Head: Normocephalic and atraumatic.     Right Ear: Hearing, tympanic membrane, ear canal and external ear normal. No tenderness. No middle ear effusion. There is no impacted cerumen. No mastoid tenderness. Tympanic membrane is not perforated, erythematous, retracted or bulging.     Left Ear: Hearing, tympanic membrane, ear canal and external ear normal. No tenderness.  No middle ear effusion. There is no impacted cerumen. No mastoid tenderness. Tympanic membrane is not perforated, erythematous, retracted or bulging.     Mouth/Throat:     Pharynx: No posterior oropharyngeal erythema.  Eyes:     General: Lids are normal. Lids are everted, no foreign bodies appreciated. Vision grossly intact. Gaze aligned appropriately. No visual field deficit.       Right eye: No foreign body, discharge or hordeolum.        Left eye: No foreign body, discharge or hordeolum.     Extraocular Movements: Extraocular movements intact.     Right eye: No nystagmus.     Left eye: No nystagmus.     Conjunctiva/sclera:     Right eye: Right conjunctiva is not injected. Hemorrhage present. No chemosis or exudate.    Left eye: Left conjunctiva is not injected. No chemosis, exudate or hemorrhage.    Pupils: Pupils are equal, round, and reactive to light. Pupils are equal.     Right eye: No corneal abrasion or fluorescein uptake. Seidel exam negative.     Left eye: No corneal abrasion or fluorescein uptake. Seidel exam negative.    Visual Fields: Right eye visual fields normal and left eye visual fields normal.      Comments: PERRLA. EOMI without pain. No proptosis.  R medial conjunctival hemorrhage. No  fluroescein uptake.   Cardiovascular:     Rate and Rhythm: Normal rate and regular rhythm.     Heart sounds: Normal heart sounds.  Pulmonary:     Effort: Pulmonary effort is normal.     Breath sounds: Normal breath sounds and air entry. No wheezing, rhonchi or rales.  Musculoskeletal:     Cervical back: Normal range of motion and neck supple. No rigidity.  Lymphadenopathy:     Cervical: No cervical adenopathy.  Neurological:     General: No focal deficit present.  Mental Status: She is alert and oriented to person, place, and time. Mental status is at baseline.     Cranial Nerves: Cranial nerves are intact. No cranial nerve deficit or facial asymmetry.     Sensory: Sensation is intact. No sensory deficit.     Motor: Motor function is intact. No weakness.     Coordination: Coordination is intact. Coordination normal.     Gait: Gait is intact. Gait normal.     Comments: CN 2-12 intact. No weakness or numbness in UEs or LEs.  Psychiatric:        Attention and Perception: Attention and perception normal.        Mood and Affect: Mood and affect normal.        Behavior: Behavior normal.        Thought Content: Thought content normal.        Judgment: Judgment normal.      UC Treatments / Results  Labs (all labs ordered are listed, but only abnormal results are displayed) Labs Reviewed - No data to display  EKG   Radiology No results found.  Procedures Procedures (including critical care time)  Medications Ordered in UC Medications - No data to display  Initial Impression / Assessment and Plan / UC Course  I have reviewed the triage vital signs and the nursing notes.  Pertinent labs & imaging results that were available during my care of the patient were reviewed by me and considered in my medical decision making (see chart for details).     -Pt presenting with subconjunctival conjunctivitis with overlying conjunctivitis. No fluorescein uptake on exam. Ciprofloxacin  drops as below. Continue warm compresses. F/u with ophthalmology if symptoms worsen/persist; new vision changes; eye pain with movement; etc.  -For hypertension, refilled Losartan-hctz as below. Pt has been on this for years without issue. rec f/u with PCP for further refills. Return precautions- chest pain, shortness of breath, worst headache of life, new/worsening fevers/chills, confusion, worsening of symptoms despite the above treatment plan, etc.  -For history of covid19, f/u with long covid clinic or PCP if continued loss of taste/smell.   Final Clinical Impressions(s) / UC Diagnoses   Final diagnoses:  Essential hypertension  History of COVID-19  Acute bacterial conjunctivitis of right eye  Subconjunctival hemorrhage of right eye     Discharge Instructions     -Ciprofloxacin drops 1-2 drops in the right eye every 4 hours while awake for 7 days -Continue warm compresses -Follow-up with ophthalmology if your eye gets worse instead of better; if you develop new vision changes; if you develop severe eye pain; etc. Information below.  -Restart the Losartan-hctz 50-12.5 for your blood pressure.     ED Prescriptions    Medication Sig Dispense Auth. Provider   lisinopril-hydrochlorothiazide (ZESTORETIC) 20-12.5 MG tablet  (Status: Discontinued) Take 1 tablet by mouth daily. 30 tablet Rhys Martini, PA-C   losartan-hydrochlorothiazide (HYZAAR) 50-12.5 MG tablet Take 1 tablet by mouth daily. 30 tablet Rhys Martini, PA-C   ciprofloxacin (CILOXAN) 0.3 % ophthalmic solution Place 1 drop into the right eye every 4 (four) hours while awake for 7 days. 1.8 mL Rhys Martini, PA-C     PDMP not reviewed this encounter.   Rhys Martini, PA-C 03/05/20 1306

## 2020-03-05 NOTE — Discharge Instructions (Addendum)
-  Ciprofloxacin drops 1-2 drops in the right eye every 4 hours while awake for 7 days -Continue warm compresses -Follow-up with ophthalmology if your eye gets worse instead of better; if you develop new vision changes; if you develop severe eye pain; etc. Information below.  -Restart the Losartan-hctz 50-12.5 for your blood pressure.

## 2020-03-05 NOTE — ED Triage Notes (Signed)
Pt present right eye redness with drainage and burning. Pt states she woke up this morning to her right eye red.

## 2021-09-09 ENCOUNTER — Emergency Department (HOSPITAL_BASED_OUTPATIENT_CLINIC_OR_DEPARTMENT_OTHER): Payer: 59

## 2021-09-09 ENCOUNTER — Encounter (HOSPITAL_BASED_OUTPATIENT_CLINIC_OR_DEPARTMENT_OTHER): Payer: Self-pay

## 2021-09-09 ENCOUNTER — Other Ambulatory Visit: Payer: Self-pay

## 2021-09-09 ENCOUNTER — Emergency Department (HOSPITAL_BASED_OUTPATIENT_CLINIC_OR_DEPARTMENT_OTHER)
Admission: EM | Admit: 2021-09-09 | Discharge: 2021-09-09 | Disposition: A | Discharge status: Home / Self Care | Payer: 59 | Attending: Emergency Medicine | Admitting: Emergency Medicine

## 2021-09-09 DIAGNOSIS — I10 Essential (primary) hypertension: Secondary | ICD-10-CM | POA: Insufficient documentation

## 2021-09-09 DIAGNOSIS — R519 Headache, unspecified: Secondary | ICD-10-CM | POA: Diagnosis present

## 2021-09-09 DIAGNOSIS — Z79899 Other long term (current) drug therapy: Secondary | ICD-10-CM | POA: Diagnosis not present

## 2021-09-09 MED ORDER — LOSARTAN POTASSIUM 25 MG PO TABS
50.0000 mg | ORAL_TABLET | Freq: Once | ORAL | Status: AC
  Administered 2021-09-09: 50 mg via ORAL
  Filled 2021-09-09: qty 2

## 2021-09-09 MED ORDER — LOSARTAN POTASSIUM-HCTZ 50-12.5 MG PO TABS: 1.0000 | ORAL_TABLET | Freq: Every day | ORAL | 1 refills | Status: DC

## 2021-09-09 MED ORDER — HYDROCHLOROTHIAZIDE 25 MG PO TABS
12.5000 mg | ORAL_TABLET | Freq: Once | ORAL | Status: AC
  Administered 2021-09-09: 12.5 mg via ORAL
  Filled 2021-09-09: qty 1

## 2021-09-09 MED ORDER — ACETAMINOPHEN 325 MG PO TABS
650.0000 mg | ORAL_TABLET | Freq: Once | ORAL | Status: AC
  Administered 2021-09-09: 650 mg via ORAL
  Filled 2021-09-09: qty 2

## 2021-09-09 NOTE — Discharge Instructions (Signed)
Recommend that you begin taking your blood pressure medication again, you can use Motrin, Tylenol as needed for headaches.  I recommend that you follow-up with your primary care doctor at your earliest convenience.  Please return to the emergency department if you have new chest pain, shortness of breath, vision changes, dizziness, or worsening headache despite treatment.

## 2021-09-09 NOTE — ED Triage Notes (Signed)
Pt reports she was at work and head began hurting approx 10 am Reports BP was 171/101. Does not have a doctor and has been out of BP medication for months

## 2021-09-09 NOTE — ED Provider Notes (Signed)
[facility] EMERGENCY DEPARTMENT Provider Note   CSN: [id number] Arrival date & time: 09/09/21  1530     History  Chief Complaint  Patient presents with   Hypertension    Sara Erickson is a 46 y.o. female with past medical history significant for known obesity, hypertension presents with concern for elevated blood pressure, and headache that began suddenly at around 10 AM this morning.  Patient reports that the headache is new for her, and worse than previous headaches.  She thinks that it may be related to her high blood pressure but is unsure.  Patient denies any numbness, tingling, chest pain, shortness of breath.  He reports that she has not been able to take her losartan-hydrochlorothiazide for months due to lapsing with her primary care doctor.   Hypertension Associated symptoms include headaches.       Home Medications Prior to Admission medications   Medication Sig Start Date End Date Taking? Authorizing Provider  losartan-hydrochlorothiazide (HYZAAR) 50-12.5 MG tablet Take 1 tablet by mouth daily. 09/09/21  Yes Henna Derderian H, PA-C  ibuprofen (ADVIL,MOTRIN) 200 MG tablet Take 200 mg by mouth every 6 (six) hours as needed for headache, mild pain or moderate pain.    [provider]  meloxicam (MOBIC) 15 MG tablet Take 1 tablet (15 mg total) by mouth daily as needed for pain. 04/09/15   Raeford Razor, MD  lisinopril-hydrochlorothiazide (ZESTORETIC) 20-12.5 MG tablet Take 1 tablet by mouth daily. 03/05/20 03/05/20  Rhys Martini, PA-C      Allergies    Patient has no known allergies.    Review of Systems   Review of Systems  Neurological:  Positive for headaches.  All other systems reviewed and are negative.   Physical Exam Updated Vital Signs BP 128/76   Pulse 71   Temp 98.2 F (36.8 C) (Oral)   Resp 17   Ht 5\' 9"  (1.753 m)   LMP 08/30/2021   SpO2 100%   BMI 31.35 kg/m  Physical Exam Vitals and nursing note reviewed.   Constitutional:      General: She is not in acute distress.    Appearance: Normal appearance.  HENT:     Head: Normocephalic and atraumatic.  Eyes:     General:        Right eye: No discharge.        Left eye: No discharge.  Cardiovascular:     Rate and Rhythm: Normal rate and regular rhythm.     Heart sounds: No murmur heard.    No friction rub. No gallop.  Pulmonary:     Effort: Pulmonary effort is normal.     Breath sounds: Normal breath sounds.  Abdominal:     General: Bowel sounds are normal.     Palpations: Abdomen is soft.  Skin:    General: Skin is warm and dry.     Capillary Refill: Capillary refill takes less than 2 seconds.  Neurological:     Mental Status: She is alert and oriented to person, place, and time.     Comments: Cranial nerves II through XII grossly intact.  Intact finger-nose, intact heel-to-shin.  Romberg negative, gait normal.  Alert and oriented x3.  Moves all 4 limbs spontaneously, normal coordination.  No pronator drift.  Intact strength 5 out of 5 bilateral upper and lower extremities.    Psychiatric:        Mood and Affect: Mood normal.        Behavior: Behavior normal.  ED Results / Procedures / Treatments   Labs (all labs ordered are listed, but only abnormal results are displayed) Labs Reviewed - No data to display  EKG EKG Interpretation  Date/Time:  Friday September 09 2021 17:01:27 EDT Ventricular Rate:  70 PR Interval:  146 QRS Duration: 87 QT Interval:  420 QTC Calculation: 454 R Axis:   44 Text Interpretation: Sinus rhythm Confirmed by Octaviano Glow [phone]) on 09/09/2021 5:02:19 PM  Radiology DG Chest 2 View  Result Date: 09/09/2021 CLINICAL DATA:  Hypertension EXAM: CHEST - 2 VIEW COMPARISON:  None Available. FINDINGS: No focal consolidation, pleural effusion, or pneumothorax. Normal cardiomediastinal silhouette. No acute osseous abnormality. IMPRESSION: No active cardiopulmonary disease. Electronically Signed   By: Placido Sou M.D.   On: 09/09/2021 17:04   CT Head Wo Contrast  Result Date: 09/09/2021 CLINICAL DATA:  Headache, sudden, severe EXAM: CT HEAD WITHOUT CONTRAST TECHNIQUE: Contiguous axial images were obtained from the base of the skull through the vertex without intravenous contrast. RADIATION DOSE REDUCTION: This exam was performed according to the departmental dose-optimization program which includes automated exposure control, adjustment of the mA and/or kV according to patient size and/or use of iterative reconstruction technique. COMPARISON:  None Available. FINDINGS: Brain: No intracranial hemorrhage, mass effect, or evidence of acute infarct. No hydrocephalus. No extra-axial fluid collection. Vascular: No hyperdense vessel or unexpected calcification. Skull: No fracture or focal lesion. Sinuses/Orbits: No acute finding. Paranasal sinuses and mastoid air cells are well aerated. Other: None. IMPRESSION: Unremarkable noncontrast CT head. Electronically Signed   By: Placido Sou M.D.   On: 09/09/2021 16:57    Procedures Procedures    Medications Ordered in ED Medications  acetaminophen (TYLENOL) tablet 650 mg (650 mg Oral Given 09/09/21 1703)  losartan (COZAAR) tablet 50 mg (50 mg Oral Given 09/09/21 1704)  hydrochlorothiazide (HYDRODIURIL) tablet 12.5 mg (12.5 mg Oral Given 09/09/21 1702)    ED Course/ Medical Decision Making/ A&P                           Medical Decision Making  This patient is a 46 y.o. female who presents to the ED for concern of high blood pressure, headache, this involves an extensive number of treatment options, and is a complaint that carries with it a high risk of complications and morbidity. The emergent differential diagnosis prior to evaluation includes, but is not limited to, hypertensive urgency, emergency, SAH, IH, dural or subdural hematoma, stroke, migraine, headache, dehydration versus other.   This is not an exhaustive differential.   Past Medical History /  Co-morbidities / Social History: History of obesity, hypertension  Additional history: Chart reviewed. Pertinent results include: Reviewed outpatient urgent care, feeling medicine visits, lab work, imaging from previous emergency department visit  Physical Exam: Physical exam performed. The pertinent findings include: No focal neurologic deficits noted.  No visual disturbance or photophobia noted.  Patient with hypertension on arrival, 163/90.  Hypertension improved after oral antihypertensives to 128/76 prior to discharge.  Lab Tests: I ordered, and personally interpreted labs.  The pertinent results include: Considered BMP, CBC for work-up of hypertension urgency/emergency, however patient without significant persistent hypertension, no oliguria, or changes in urinary function, no previous CKD, think it is reasonable to hold off at this time as patient is beginning to feel better, and encouraged follow-up with PCP for lab recheck.   Imaging Studies: I ordered imaging studies including plain film chest x-ray, CT head without contrast.  I independently visualized and interpreted imaging which showed no intrathoracic or intracranial abnormality. I agree with the radiologist interpretation.   Cardiac Monitoring:  The patient was maintained on a cardiac monitor.  My attending physician Dr. Renaye Rakers viewed and interpreted the cardiac monitored which showed an underlying rhythm of: NSR. I agree with this interpretation.   Medications: I ordered medication including losartan, hydrochlorothiazide, Tylenol for headache, hypertension. Reevaluation of the patient after these medicines showed that the patient improved. I have reviewed the patients home medicines and have made adjustments as needed.   Disposition: After consideration of the diagnostic results and the patients response to treatment, I feel that patient has acute, non-intractable headache, no evidence of acute intracranial abnormality on CT  scan..   emergency department workup does not suggest an emergent condition requiring admission or immediate intervention beyond what has been performed at this time. The plan is: Refill antihypertensive, OTC ibuprofen, Tylenol for headache, and PCP follow-up. The patient is safe for discharge and has been instructed to return immediately for worsening symptoms, change in symptoms or any other concerns.  I discussed this case with my attending physician Dr. Renaye Rakers who cosigned this note including patient's presenting symptoms, physical exam, and planned diagnostics and interventions. Attending physician stated agreement with plan or made changes to plan which were implemented.    Final Clinical Impression(s) / ED Diagnoses Final diagnoses:  Hypertension, unspecified type  Acute nonintractable headache, unspecified headache type    Rx / DC Orders ED Discharge Orders          Ordered    losartan-hydrochlorothiazide (HYZAAR) 50-12.5 MG tablet  Daily        09/09/21 1727              Olene Floss, PA-C 09/09/21 1757    Terald Sleeper, MD 09/09/21 1758

## 2021-09-09 NOTE — ED Notes (Signed)
Asked Pt. To change into a gown and will return to see the Pt.

## 2021-09-10 ENCOUNTER — Encounter (HOSPITAL_BASED_OUTPATIENT_CLINIC_OR_DEPARTMENT_OTHER): Payer: Self-pay | Admitting: Emergency Medicine

## 2021-09-10 ENCOUNTER — Emergency Department (HOSPITAL_BASED_OUTPATIENT_CLINIC_OR_DEPARTMENT_OTHER)
Admission: EM | Admit: 2021-09-10 | Discharge: 2021-09-10 | Disposition: A | Discharge status: Home / Self Care | Payer: 59 | Attending: Emergency Medicine | Admitting: Emergency Medicine

## 2021-09-10 DIAGNOSIS — R519 Headache, unspecified: Secondary | ICD-10-CM | POA: Insufficient documentation

## 2021-09-10 DIAGNOSIS — I10 Essential (primary) hypertension: Secondary | ICD-10-CM | POA: Diagnosis not present

## 2021-09-10 DIAGNOSIS — Z79899 Other long term (current) drug therapy: Secondary | ICD-10-CM | POA: Diagnosis not present

## 2021-09-10 DIAGNOSIS — R030 Elevated blood-pressure reading, without diagnosis of hypertension: Secondary | ICD-10-CM | POA: Insufficient documentation

## 2021-09-10 MED ORDER — KETOROLAC TROMETHAMINE 15 MG/ML IJ SOLN
15.0000 mg | Freq: Once | INTRAMUSCULAR | Status: AC
  Administered 2021-09-10: 15 mg via INTRAMUSCULAR
  Filled 2021-09-10: qty 1

## 2021-09-10 MED ORDER — PROCHLORPERAZINE EDISYLATE 10 MG/2ML IJ SOLN
10.0000 mg | Freq: Once | INTRAMUSCULAR | Status: AC
Start: 2021-09-10 — End: 2021-09-10
  Administered 2021-09-10: 10 mg via INTRAMUSCULAR
  Filled 2021-09-10: qty 2

## 2021-09-10 NOTE — Discharge Instructions (Addendum)
Your exam today was reassuring.  You received 2 shots for your headache with improvement in your symptoms.  No concerning signs or symptoms for stroke, heart attack, or other concerning findings in setting of elevated blood pressure.  As discussed continue taking blood pressure medications as directed.  Keep a blood pressure diary.  If you have concerning symptoms such as issues with your balance, chest pain, shortness of breath, vision change please return for evaluation.

## 2021-09-10 NOTE — ED Provider Notes (Signed)
[facility] EMERGENCY DEPARTMENT Provider Note   CSN: [id number] Arrival date & time: 09/10/21  1440     History  Chief Complaint  Patient presents with   Hypertension    Sara Erickson is a 46 y.o. female.  46 year old female with past medical history significant for hypertension noncompliant with her antihypertensives presents today for evaluation of headache.  Patient was also seen for same complaint yesterday and discharged with refill on her antihypertensive.  Today was her first dose.  Blood pressure still elevated and she still has headache.  Has not taken anything for headache since leaving the emergency room yesterday.  Denies change in vision, difficulty with balance, numbness, or other strokelike symptoms, chest pain, shortness of breath.  Patient states that she feels as if she needs to rest.  States she does not currently have a PCP.  States she has had a headache now for about a week.  Denies history of migraines but does endorse occasional headaches.  The history is provided by the patient. No language interpreter was used.       Home Medications Prior to Admission medications   Medication Sig Start Date End Date Taking? Authorizing Provider  ibuprofen (ADVIL,MOTRIN) 200 MG tablet Take 200 mg by mouth every 6 (six) hours as needed for headache, mild pain or moderate pain.    [provider]  losartan-hydrochlorothiazide (HYZAAR) 50-12.5 MG tablet Take 1 tablet by mouth daily. 09/09/21   Prosperi, Christian H, PA-C  meloxicam (MOBIC) 15 MG tablet Take 1 tablet (15 mg total) by mouth daily as needed for pain. 04/09/15   Raeford Razor, MD  lisinopril-hydrochlorothiazide (ZESTORETIC) 20-12.5 MG tablet Take 1 tablet by mouth daily. 03/05/20 03/05/20  Rhys Martini, PA-C      Allergies    Patient has no known allergies.    Review of Systems   Review of Systems  Constitutional:  Negative for chills and fever.  Eyes:  Negative for photophobia, redness  and visual disturbance.  Respiratory:  Negative for shortness of breath.   Cardiovascular:  Negative for chest pain and palpitations.  Gastrointestinal:  Negative for nausea and vomiting.  Neurological:  Positive for headaches. Negative for syncope, speech difficulty, light-headedness and numbness.  All other systems reviewed and are negative.   Physical Exam Updated Vital Signs BP (!) 153/105   Pulse 80   Temp 97.8 F (36.6 C) (Oral)   Resp 18   Ht 5\' 9"  (1.753 m)   Wt 104.3 kg   LMP 08/30/2021   SpO2 100%   BMI 33.97 kg/m  Physical Exam Vitals and nursing note reviewed.  Constitutional:      General: She is not in acute distress.    Appearance: Normal appearance. She is not ill-appearing.  HENT:     Head: Normocephalic and atraumatic.     Nose: Nose normal.  Eyes:     General: No scleral icterus.    Extraocular Movements: Extraocular movements intact.     Conjunctiva/sclera: Conjunctivae normal.     Pupils: Pupils are equal, round, and reactive to light.  Cardiovascular:     Rate and Rhythm: Normal rate and regular rhythm.     Pulses: Normal pulses.     Heart sounds: Normal heart sounds.  Pulmonary:     Effort: Pulmonary effort is normal. No respiratory distress.     Breath sounds: Normal breath sounds. No wheezing or rales.  Abdominal:     General: There is no distension.  Tenderness: There is no abdominal tenderness.  Musculoskeletal:        General: Normal range of motion.     Cervical back: Normal range of motion.  Skin:    General: Skin is warm and dry.  Neurological:     General: No focal deficit present.     Mental Status: She is alert. Mental status is at baseline.     Comments: Cranial nerves III through XII intact.  Without facial droop.  Without pronator drift.  Full range of motion bilateral upper and lower extremities with 5/5 strength in extensor and flexor muscle groups.     ED Results / Procedures / Treatments   Labs (all labs ordered are  listed, but only abnormal results are displayed) Labs Reviewed - No data to display  EKG None  Radiology DG Chest 2 View  Result Date: 09/09/2021 CLINICAL DATA:  Hypertension EXAM: CHEST - 2 VIEW COMPARISON:  None Available. FINDINGS: No focal consolidation, pleural effusion, or pneumothorax. Normal cardiomediastinal silhouette. No acute osseous abnormality. IMPRESSION: No active cardiopulmonary disease. Electronically Signed   By: Minerva Fester M.D.   On: 09/09/2021 17:04   CT Head Wo Contrast  Result Date: 09/09/2021 CLINICAL DATA:  Headache, sudden, severe EXAM: CT HEAD WITHOUT CONTRAST TECHNIQUE: Contiguous axial images were obtained from the base of the skull through the vertex without intravenous contrast. RADIATION DOSE REDUCTION: This exam was performed according to the departmental dose-optimization program which includes automated exposure control, adjustment of the mA and/or kV according to patient size and/or use of iterative reconstruction technique. COMPARISON:  None Available. FINDINGS: Brain: No intracranial hemorrhage, mass effect, or evidence of acute infarct. No hydrocephalus. No extra-axial fluid collection. Vascular: No hyperdense vessel or unexpected calcification. Skull: No fracture or focal lesion. Sinuses/Orbits: No acute finding. Paranasal sinuses and mastoid air cells are well aerated. Other: None. IMPRESSION: Unremarkable noncontrast CT head. Electronically Signed   By: Minerva Fester M.D.   On: 09/09/2021 16:57    Procedures Procedures    Medications Ordered in ED Medications  prochlorperazine (COMPAZINE) injection 10 mg (has no administration in time range)  ketorolac (TORADOL) 15 MG/ML injection 15 mg (has no administration in time range)    ED Course/ Medical Decision Making/ A&P                           Medical Decision Making Risk Prescription drug management.   Medical Decision Making / ED Course   This patient presents to the ED for concern of  elevated blood pressure, headache, this involves an extensive number of treatment options, and is a complaint that carries with it a high risk of complications and morbidity.  The differential diagnosis includes hypertensive emergency, elevated blood pressure, CVA, tension headache, migraine  MDM: 46 year old female with past medical history significant for hypertension noncompliant with antihypertensive presents today for evaluation headache, and elevated blood pressure.  She was evaluated for same complaint yesterday and had a normal head CT.  She was prescribed antihypertensives.  Today was her first dose.  She is without signs or symptoms concerning for hypertensive emergency.  Patient was given Compazine, Toradol with improvement in her headache.  EKG without acute ischemic changes.  Given work-up yesterday with CT head no need to repeat this at this time.  Neurological exam without focal deficits.  Discussed compliance with BP medication, and establishing PCP.  Referral information for [place] [facility] provided.  Discussed  blood pressure diary.  Patient voices understanding and is in agreement with plan.   Lab Tests: -I ordered, reviewed, and interpreted labs.   The pertinent results include:   Labs Reviewed - No data to display    EKG  EKG Interpretation  Date/Time:    Ventricular Rate:    PR Interval:    QRS Duration:   QT Interval:    QTC Calculation:   R Axis:     Text Interpretation:           Medicines ordered and prescription drug management: Meds ordered this encounter  Medications   prochlorperazine (COMPAZINE) injection 10 mg   ketorolac (TORADOL) 15 MG/ML injection 15 mg    -I have reviewed the patients home medicines and have made adjustments as needed   Cardiac Monitoring: The patient was maintained on a cardiac monitor.  I personally viewed and interpreted the cardiac monitored which showed an underlying rhythm of:  NSR  Reevaluation: After the interventions noted above, I reevaluated the patient and found that they have :improved  Co morbidities that complicate the patient evaluation  Past Medical History:  Diagnosis Date   Hypertension    Murmur 09/17/2014   Obesity (BMI 30.0-34.9) 09/17/2014      Dispostion: Patient is appropriate for discharge.  Discharged in stable condition.  Return precautions discussed.    Final Clinical Impression(s) / ED Diagnoses Final diagnoses:  Elevated blood pressure reading  Nonintractable headache, unspecified chronicity pattern, unspecified headache type    Rx / DC Orders ED Discharge Orders     None         Marita Kansas, PA-C 09/10/21 1629    Derwood Kaplan, MD 09/10/21 [phone]

## 2021-09-10 NOTE — ED Triage Notes (Signed)
Pt arrives pov, to triage in wheelchair, c/o htn and pressure behind eyes. Took b/p meds at 0930 today. Denies cp, denies n/v

## 2021-09-22 ENCOUNTER — Ambulatory Visit (INDEPENDENT_AMBULATORY_CARE_PROVIDER_SITE_OTHER): Payer: 59

## 2021-09-22 ENCOUNTER — Ambulatory Visit: Payer: 59 | Admitting: Podiatry

## 2021-09-22 DIAGNOSIS — M7752 Other enthesopathy of left foot: Secondary | ICD-10-CM

## 2021-09-22 DIAGNOSIS — M7751 Other enthesopathy of right foot: Secondary | ICD-10-CM

## 2021-09-22 DIAGNOSIS — M722 Plantar fascial fibromatosis: Secondary | ICD-10-CM

## 2021-09-22 DIAGNOSIS — M7750 Other enthesopathy of unspecified foot: Secondary | ICD-10-CM

## 2021-09-22 MED ORDER — DICLOFENAC SODIUM 75 MG PO TBEC: 75.0000 mg | DELAYED_RELEASE_TABLET | Freq: Two times a day (BID) | ORAL | 0 refills | Status: DC

## 2021-09-22 MED ORDER — METHYLPREDNISOLONE 4 MG PO TBPK: ORAL_TABLET | ORAL | 0 refills | Status: DC

## 2021-09-22 NOTE — Progress Notes (Signed)
  Subjective:  Patient ID: Sara Erickson, female    DOB: Jun 16, 1975,  MRN: [id number]  Chief Complaint  Patient presents with   Plantar Fasciitis    NP - she has seen Dr. Elijah Birk in the past, both heels hurt, left is worse than right - she has had injections in the past, but none this year (2023)    46 y.o. female presents with the above complaint. History confirmed with patient.  Previously took meloxicam as well this did not help much  Objective:  Physical Exam: warm, good capillary refill, no trophic changes or ulcerative lesions, normal DP and PT pulses, normal sensory exam, and bilateral she has pain on palpation of the medial heel at the assertion of the plantar fascia.  Radiographs: Multiple views x-ray of both feet: no fracture, dislocation, swelling or degenerative changes noted and plantar calcaneal spur Assessment:   1. Plantar fasciitis, bilateral      Plan:  Patient was evaluated and treated and all questions answered.  Discussed the etiology and treatment options for plantar fasciitis including stretching, formal physical therapy, supportive shoegears such as a running shoe or sneaker, pre fabricated orthoses, injection therapy, and oral medications. We also discussed the role of surgical treatment of this for patients who do not improve after exhausting non-surgical treatment options.   -XR reviewed with patient -Educated patient on stretching and icing of the affected limb -Plantar fascial brace dispensed to support the plantar fascia and offload the medial longitudinal arch while she can work, she is unable to work in CAM boot -Injection delivered to the plantar fascia of both feet. -Rx for medrol pack. Educated on use, risks, and benefits of the medication -Rx for diclofenac twice daily sent to pharmacy as well.  She will take this following the Medrol Dosepak -Referral to [facility] here in [place] also sent  Return in about 6 weeks (around  11/03/2021) for recheck plantar fasciitis.

## 2021-09-22 NOTE — Patient Instructions (Signed)

## 2021-12-02 ENCOUNTER — Emergency Department (HOSPITAL_COMMUNITY)
Admission: EM | Admit: 2021-12-02 | Discharge: 2021-12-03 | Disposition: A | Discharge status: Home / Self Care | Payer: 59 | Attending: Emergency Medicine | Admitting: Emergency Medicine

## 2021-12-02 ENCOUNTER — Encounter (HOSPITAL_COMMUNITY): Payer: Self-pay

## 2021-12-02 ENCOUNTER — Emergency Department (HOSPITAL_COMMUNITY): Payer: 59

## 2021-12-02 DIAGNOSIS — N938 Other specified abnormal uterine and vaginal bleeding: Secondary | ICD-10-CM

## 2021-12-02 DIAGNOSIS — R109 Unspecified abdominal pain: Secondary | ICD-10-CM | POA: Diagnosis not present

## 2021-12-02 DIAGNOSIS — N939 Abnormal uterine and vaginal bleeding, unspecified: Secondary | ICD-10-CM | POA: Insufficient documentation

## 2021-12-02 DIAGNOSIS — N9489 Other specified conditions associated with female genital organs and menstrual cycle: Secondary | ICD-10-CM | POA: Diagnosis not present

## 2021-12-02 DIAGNOSIS — D500 Iron deficiency anemia secondary to blood loss (chronic): Secondary | ICD-10-CM

## 2021-12-02 DIAGNOSIS — D649 Anemia, unspecified: Secondary | ICD-10-CM | POA: Insufficient documentation

## 2021-12-02 LAB — PREPARE RBC (CROSSMATCH)

## 2021-12-02 LAB — CBC WITH DIFFERENTIAL/PLATELET
Abs Immature Granulocytes: 0.02 10*3/uL (ref 0.00–0.07)
Basophils Absolute: 0.1 10*3/uL (ref 0.0–0.1)
Basophils Relative: 1 %
Eosinophils Absolute: 0.7 10*3/uL — ABNORMAL HIGH (ref 0.0–0.5)
Eosinophils Relative: 7 %
HCT: 21.3 % — ABNORMAL LOW (ref 36.0–46.0)
Hemoglobin: 5.9 g/dL — CL (ref 12.0–15.0)
Immature Granulocytes: 0 %
Lymphocytes Relative: 34 %
Lymphs Abs: 3.3 10*3/uL (ref 0.7–4.0)
MCH: 20.6 pg — ABNORMAL LOW (ref 26.0–34.0)
MCHC: 27.7 g/dL — ABNORMAL LOW (ref 30.0–36.0)
MCV: 74.2 fL — ABNORMAL LOW (ref 80.0–100.0)
Monocytes Absolute: 0.6 10*3/uL (ref 0.1–1.0)
Monocytes Relative: 6 %
Neutro Abs: 5 10*3/uL (ref 1.7–7.7)
Neutrophils Relative %: 52 %
Platelets: 184 10*3/uL (ref 150–400)
RBC: 2.87 MIL/uL — ABNORMAL LOW (ref 3.87–5.11)
RDW: 17.2 % — ABNORMAL HIGH (ref 11.5–15.5)
WBC: 9.7 10*3/uL (ref 4.0–10.5)
nRBC: 0 % (ref 0.0–0.2)

## 2021-12-02 LAB — URINALYSIS, ROUTINE W REFLEX MICROSCOPIC
Bacteria, UA: NONE SEEN
Bilirubin Urine: NEGATIVE
Glucose, UA: NEGATIVE mg/dL
Ketones, ur: NEGATIVE mg/dL
Nitrite: NEGATIVE
Protein, ur: NEGATIVE mg/dL
Specific Gravity, Urine: 1.017 (ref 1.005–1.030)
pH: 6 (ref 5.0–8.0)

## 2021-12-02 LAB — COMPREHENSIVE METABOLIC PANEL
ALT: 17 U/L (ref 0–44)
AST: 30 U/L (ref 15–41)
Albumin: 3.6 g/dL (ref 3.5–5.0)
Alkaline Phosphatase: 51 U/L (ref 38–126)
Anion gap: 5 (ref 5–15)
BUN: 13 mg/dL (ref 6–20)
CO2: 24 mmol/L (ref 22–32)
Calcium: 8.3 mg/dL — ABNORMAL LOW (ref 8.9–10.3)
Chloride: 109 mmol/L (ref 98–111)
Creatinine, Ser: 0.95 mg/dL (ref 0.44–1.00)
GFR, Estimated: >60 mL/min (ref >60)
Glucose, Bld: 102 mg/dL — ABNORMAL HIGH (ref 70–99)
Potassium: 3.7 mmol/L (ref 3.5–5.1)
Sodium: 138 mmol/L (ref 135–145)
Total Bilirubin: 0.4 mg/dL (ref 0.3–1.2)
Total Protein: 7.8 g/dL (ref 6.5–8.1)

## 2021-12-02 LAB — I-STAT BETA HCG BLOOD, ED (MC, WL, AP ONLY): I-stat hCG, quantitative: <5 m[IU]/mL (ref <5)

## 2021-12-02 LAB — ABO/RH: ABO/RH(D): A POS

## 2021-12-02 MED ORDER — SODIUM CHLORIDE 0.9 % IV SOLN
10.0000 mL/h | Freq: Once | INTRAVENOUS | Status: AC
  Administered 2021-12-02: 10 mL/h via INTRAVENOUS

## 2021-12-02 MED ORDER — FERROUS SULFATE 325 (65 FE) MG PO TABS: 325.0000 mg | ORAL_TABLET | Freq: Every day | ORAL | 0 refills | Status: AC

## 2021-12-02 NOTE — Discharge Instructions (Addendum)
Follow-up with Dr. Garwin Brothers

## 2021-12-02 NOTE — ED Provider Notes (Signed)
[facility] DEPT Provider Note   CSN: [id number] Arrival date & time: 12/02/21  1502     History  Chief Complaint  Patient presents with   Abnormal Lab    Sara Erickson is a 46 y.o. female.  46 year old female who presents with low hemoglobin.  Patient saw her colleges this week and was found to have hemoglobin of 6.2.  She has been having heavy menstrual cycles.  She describes having menstrual cycles lasting for approximate 10 days and going through 6 pads a day and passing clots at times.  She denies any dizziness lightheadedness.  She has not been short of breath.  Denies any uterine cramping.  Only has faint bleeding at this time.  Was sent here for further management.  Patient's gynecologist is Dr. Garwin Brothers       Home Medications Prior to Admission medications   Medication Sig Start Date End Date Taking? Authorizing Provider  diclofenac (VOLTAREN) 75 MG EC tablet Take 1 tablet (75 mg total) by mouth 2 (two) times daily. 09/22/21   McDonald, Stephan Minister, DPM  ibuprofen (ADVIL,MOTRIN) 200 MG tablet Take 200 mg by mouth every 6 (six) hours as needed for headache, mild pain or moderate pain.    [provider]  losartan-hydrochlorothiazide (HYZAAR) 50-12.5 MG tablet Take 1 tablet by mouth daily. 09/09/21   Prosperi, Christian H, PA-C  meloxicam (MOBIC) 15 MG tablet Take 1 tablet (15 mg total) by mouth daily as needed for pain. 04/09/15   Virgel Manifold, MD  methylPREDNISolone (MEDROL DOSEPAK) 4 MG TBPK tablet 6 day dose pack - take as directed 09/22/21   Criselda Peaches, DPM  lisinopril-hydrochlorothiazide (ZESTORETIC) 20-12.5 MG tablet Take 1 tablet by mouth daily. 03/05/20 03/05/20  Hazel Sams, PA-C      Allergies    Patient has no known allergies.    Review of Systems   Review of Systems  All other systems reviewed and are negative.   Physical Exam Updated Vital Signs BP (!) 157/80 (BP Location: Left Arm)   Pulse 68   Temp 98.4 F  (36.9 C) (Oral)   Resp 18   SpO2 100%  Physical Exam Vitals and nursing note reviewed.  Constitutional:      General: She is not in acute distress.    Appearance: Normal appearance. She is well-developed. She is not toxic-appearing.  HENT:     Head: Normocephalic and atraumatic.  Eyes:     General: Lids are normal.     Conjunctiva/sclera: Conjunctivae normal.     Pupils: Pupils are equal, round, and reactive to light.     Comments: Pale conjunctive a noted  Neck:     Thyroid: No thyroid mass.     Trachea: No tracheal deviation.  Cardiovascular:     Rate and Rhythm: Normal rate and regular rhythm.     Heart sounds: Normal heart sounds. No murmur heard.    No gallop.  Pulmonary:     Effort: Pulmonary effort is normal. No respiratory distress.     Breath sounds: Normal breath sounds. No stridor. No decreased breath sounds, wheezing, rhonchi or rales.  Abdominal:     General: There is no distension.     Palpations: Abdomen is soft.     Tenderness: There is no abdominal tenderness. There is no rebound.  Musculoskeletal:        General: No tenderness. Normal range of motion.     Cervical back: Normal range of motion and neck supple.  Skin:    General: Skin is warm and dry.     Findings: No abrasion or rash.  Neurological:     Mental Status: She is alert and oriented to person, place, and time. Mental status is at baseline.     GCS: GCS eye subscore is 4. GCS verbal subscore is 5. GCS motor subscore is 6.     Cranial Nerves: No cranial nerve deficit.     Sensory: No sensory deficit.     Motor: Motor function is intact.  Psychiatric:        Attention and Perception: Attention normal.        Speech: Speech normal.        Behavior: Behavior normal.     ED Results / Procedures / Treatments   Labs (all labs ordered are listed, but only abnormal results are displayed) Labs Reviewed  CBC WITH DIFFERENTIAL/PLATELET - Abnormal; Notable for the following components:      Result  Value   RBC 2.87 (*)    Hemoglobin 5.9 (*)    HCT 21.3 (*)    MCV 74.2 (*)    MCH 20.6 (*)    MCHC 27.7 (*)    RDW 17.2 (*)    Eosinophils Absolute 0.7 (*)    All other components within normal limits  COMPREHENSIVE METABOLIC PANEL - Abnormal; Notable for the following components:   Glucose, Bld 102 (*)    Calcium 8.3 (*)    All other components within normal limits  URINALYSIS, ROUTINE W REFLEX MICROSCOPIC - Abnormal; Notable for the following components:   Hgb urine dipstick LARGE (*)    Leukocytes,Ua SMALL (*)    All other components within normal limits  I-STAT BETA HCG BLOOD, ED ([facility], [facility], [facility] ONLY)  TYPE AND SCREEN  PREPARE RBC (CROSSMATCH)    EKG None  Radiology US Pelvis Complete  Result Date: 12/02/2021 CLINICAL DATA:  Menorrhagia EXAM: TRANSABDOMINAL AND TRANSVAGINAL ULTRASOUND OF PELVIS TECHNIQUE: Both transabdominal and transvaginal ultrasound examinations of the pelvis were performed. Transabdominal technique was performed for global imaging of the pelvis including uterus, ovaries, adnexal regions, and pelvic cul-de-sac. It was necessary to proceed with endovaginal exam following the transabdominal exam to visualize the endometrium and right ovary. COMPARISON:  None Available. FINDINGS: Uterus Measurements: 9.6 x 5.5 x 5.7 cm = volume: 158 mL. No fibroids or other mass visualized. Endometrium Thickness: 10 mm.  No focal abnormality visualized. Right ovary Measurements: 2.4 x 3.7 x 2.3 cm = volume: 10.7 mL. 3.2 x 4.1 x 3.8 cm simple para ovarian cyst, benign. No follow-up is recommended. Left ovary Not discretely visualized.  No adnexal mass is seen. Other findings No abnormal free fluid. IMPRESSION: Left ovary is not discretely visualized.  No adnexal mass is seen. Otherwise negative pelvic ultrasound. Electronically Signed   By: Charline Bills M.D.   On: 12/02/2021 17:10   US Transvaginal Non-OB  Result Date: 12/02/2021 CLINICAL DATA:  Menorrhagia EXAM: TRANSABDOMINAL  AND TRANSVAGINAL ULTRASOUND OF PELVIS TECHNIQUE: Both transabdominal and transvaginal ultrasound examinations of the pelvis were performed. Transabdominal technique was performed for global imaging of the pelvis including uterus, ovaries, adnexal regions, and pelvic cul-de-sac. It was necessary to proceed with endovaginal exam following the transabdominal exam to visualize the endometrium and right ovary. COMPARISON:  None Available. FINDINGS: Uterus Measurements: 9.6 x 5.5 x 5.7 cm = volume: 158 mL. No fibroids or other mass visualized. Endometrium Thickness: 10 mm.  No focal abnormality visualized. Right ovary Measurements: 2.4 x 3.7  x 2.3 cm = volume: 10.7 mL. 3.2 x 4.1 x 3.8 cm simple para ovarian cyst, benign. No follow-up is recommended. Left ovary Not discretely visualized.  No adnexal mass is seen. Other findings No abnormal free fluid. IMPRESSION: Left ovary is not discretely visualized.  No adnexal mass is seen. Otherwise negative pelvic ultrasound. Electronically Signed   By: Charline Bills M.D.   On: 12/02/2021 17:10    Procedures Procedures    Medications Ordered in ED Medications  0.9 %  sodium chloride infusion (has no administration in time range)    ED Course/ Medical Decision Making/ A&P                           Medical Decision Making Amount and/or Complexity of Data Reviewed Labs: ordered.  Risk Prescription drug management.   Patient here with history of anemia likely due to heavy menstrual cycles.  Patient hemoglobin here is 5.9.  Patient has no evidence of GI bleeding.  Pelvic ultrasound per my review interpretation showed no acute findings to explain patient's vaginal bleeding.  Patient will be transfused with 2 units of packed red blood cells.  I attempted to contact her GYN doctor unsuccessfully.  The patient has no severe vaginal bleeding at this time.  Plan will be to place patient on iron therapy and follow-up with her gynecologist.  CRITICAL CARE Performed  by: Toy Baker Total critical care time: 45 minutes Critical care time was exclusive of separately billable procedures and treating other patients. Critical care was necessary to treat or prevent imminent or life-threatening deterioration. Critical care was time spent personally by me on the following activities: development of treatment plan with patient and/or surrogate as well as nursing, discussions with consultants, evaluation of patient's response to treatment, examination of patient, obtaining history from patient or surrogate, ordering and performing treatments and interventions, ordering and review of laboratory studies, ordering and review of radiographic studies, pulse oximetry and re-evaluation of patient's condition.         Final Clinical Impression(s) / ED Diagnoses Final diagnoses:  None    Rx / DC Orders ED Discharge Orders     None         Lorre Nick, MD 12/02/21 2325

## 2021-12-02 NOTE — ED Triage Notes (Signed)
Pt arrived via POV, states heavy bleeding on menstrual cycle, saw PCP and was told to come to ED due to abnormal levels.

## 2021-12-02 NOTE — ED Provider Triage Note (Signed)
Emergency Medicine Provider Triage Evaluation Note  SHANNAN SLINKER , a 46 y.o. female  was evaluated in triage.  Pt complains of heavy and irregular menstrual bleeding.  She states this started in July.  Has had heavy bleeding with increasing clots followed by lighter bleeding.  This has been cyclical and irregular since July.  Also complains of shortness of breath when walking multiple flights of stairs.  Patient was seen by her gynecologist yesterday and was found to have hemoglobin of 6.2.  Was told to come to the ED for further evaluation and management.  Review of Systems  Positive: As above Negative: Chest pain, dizziness, lightheadedness, abdominal pain, urinary symptoms  Physical Exam  BP (!) 142/79 (BP Location: Left Arm)   Pulse 78   Temp 98.3 F (36.8 C) (Oral)   Resp 18   SpO2 100%  Gen:   Awake, no distress   Resp:  Normal effort  MSK:   Moves extremities without difficulty  Other:    Medical Decision Making  Medically screening exam initiated at 3:37 PM.  Appropriate orders placed.  ORPAH HAUSNER was informed that the remainder of the evaluation will be completed by another provider, this initial triage assessment does not replace that evaluation, and the importance of remaining in the ED until their evaluation is complete.  We will obtain basic labs and pelvic ultrasound   Roylene Reason, PA-C 12/02/21 1538

## 2021-12-02 NOTE — ED Notes (Signed)
Patient wants to know if she can eat. Messaged Dr. Zenia Resides who stated that she can. Offered patient what we have, and she said that someone is bringing her a salad.

## 2021-12-03 ENCOUNTER — Other Ambulatory Visit: Payer: Self-pay

## 2021-12-03 MED ORDER — LOSARTAN POTASSIUM-HCTZ 50-12.5 MG PO TABS: 1.0000 | ORAL_TABLET | Freq: Every day | ORAL | 1 refills | Status: AC

## 2021-12-05 LAB — TYPE AND SCREEN
ABO/RH(D): A POS
Antibody Screen: NEGATIVE
Unit division: 0
Unit division: 0

## 2021-12-05 LAB — BPAM RBC
Blood Product Expiration Date: 202311232359
Blood Product Expiration Date: 202311232359
ISSUE DATE / TIME: 202310272017
ISSUE DATE / TIME: 202310280009
Unit Type and Rh: 6200
Unit Type and Rh: 6200

## 2021-12-28 ENCOUNTER — Telehealth: Payer: Self-pay | Admitting: Hematology and Oncology

## 2021-12-28 NOTE — Telephone Encounter (Signed)
Scheduled appointment per referral. Patient is aware of appointment date and time. Patient is aware to arrive 15 mins prior to appointment time and to bring updated insurance cards. Patient is aware of location.   

## 2022-01-11 ENCOUNTER — Inpatient Hospital Stay: Payer: 59 | Attending: Hematology and Oncology | Admitting: Hematology and Oncology

## 2022-01-11 ENCOUNTER — Other Ambulatory Visit: Payer: Self-pay

## 2022-01-11 ENCOUNTER — Inpatient Hospital Stay: Payer: 59

## 2022-01-11 ENCOUNTER — Encounter: Payer: Self-pay | Admitting: Hematology and Oncology

## 2022-01-11 VITALS — BP 147/92 | HR 64 | Temp 98.1°F | Resp 16 | Ht 69.0 in | Wt 206.7 lb

## 2022-01-11 DIAGNOSIS — Z6830 Body mass index (BMI) 30.0-30.9, adult: Secondary | ICD-10-CM | POA: Insufficient documentation

## 2022-01-11 DIAGNOSIS — D500 Iron deficiency anemia secondary to blood loss (chronic): Secondary | ICD-10-CM | POA: Diagnosis present

## 2022-01-11 DIAGNOSIS — R42 Dizziness and giddiness: Secondary | ICD-10-CM | POA: Insufficient documentation

## 2022-01-11 DIAGNOSIS — Z803 Family history of malignant neoplasm of breast: Secondary | ICD-10-CM | POA: Diagnosis not present

## 2022-01-11 DIAGNOSIS — R011 Cardiac murmur, unspecified: Secondary | ICD-10-CM | POA: Insufficient documentation

## 2022-01-11 DIAGNOSIS — F5089 Other specified eating disorder: Secondary | ICD-10-CM | POA: Diagnosis not present

## 2022-01-11 DIAGNOSIS — E669 Obesity, unspecified: Secondary | ICD-10-CM | POA: Insufficient documentation

## 2022-01-11 DIAGNOSIS — Z79899 Other long term (current) drug therapy: Secondary | ICD-10-CM | POA: Insufficient documentation

## 2022-01-11 DIAGNOSIS — Z801 Family history of malignant neoplasm of trachea, bronchus and lung: Secondary | ICD-10-CM | POA: Insufficient documentation

## 2022-01-11 DIAGNOSIS — I10 Essential (primary) hypertension: Secondary | ICD-10-CM | POA: Insufficient documentation

## 2022-01-11 DIAGNOSIS — R0602 Shortness of breath: Secondary | ICD-10-CM | POA: Diagnosis not present

## 2022-01-11 DIAGNOSIS — D509 Iron deficiency anemia, unspecified: Secondary | ICD-10-CM | POA: Insufficient documentation

## 2022-01-11 DIAGNOSIS — R5383 Other fatigue: Secondary | ICD-10-CM | POA: Insufficient documentation

## 2022-01-11 DIAGNOSIS — N920 Excessive and frequent menstruation with regular cycle: Secondary | ICD-10-CM | POA: Insufficient documentation

## 2022-01-11 LAB — CBC WITH DIFFERENTIAL/PLATELET
Abs Immature Granulocytes: 0.02 10*3/uL (ref 0.00–0.07)
Basophils Absolute: 0.1 10*3/uL (ref 0.0–0.1)
Basophils Relative: 1 %
Eosinophils Absolute: 0.6 10*3/uL — ABNORMAL HIGH (ref 0.0–0.5)
Eosinophils Relative: 6 %
HCT: 37.3 % (ref 36.0–46.0)
Hemoglobin: 12 g/dL (ref 12.0–15.0)
Immature Granulocytes: 0 %
Lymphocytes Relative: 22 %
Lymphs Abs: 2.1 10*3/uL (ref 0.7–4.0)
MCH: 26.8 pg (ref 26.0–34.0)
MCHC: 32.2 g/dL (ref 30.0–36.0)
MCV: 83.3 fL (ref 80.0–100.0)
Monocytes Absolute: 0.4 10*3/uL (ref 0.1–1.0)
Monocytes Relative: 4 %
Neutro Abs: 6.3 10*3/uL (ref 1.7–7.7)
Neutrophils Relative %: 67 %
Platelets: 449 10*3/uL — ABNORMAL HIGH (ref 150–400)
RBC: 4.48 MIL/uL (ref 3.87–5.11)
RDW: 26 % — ABNORMAL HIGH (ref 11.5–15.5)
WBC: 9.5 10*3/uL (ref 4.0–10.5)
nRBC: 0 % (ref 0.0–0.2)

## 2022-01-11 LAB — IRON AND IRON BINDING CAPACITY (CC-WL,HP ONLY)
Iron: 48 ug/dL (ref 28–170)
Saturation Ratios: 15 % (ref 10.4–31.8)
TIBC: 326 ug/dL (ref 250–450)
UIBC: 278 ug/dL (ref 148–442)

## 2022-01-11 LAB — FERRITIN: Ferritin: 45 ng/mL (ref 11–307)

## 2022-01-11 NOTE — Assessment & Plan Note (Signed)
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  She is currently on oral iron supplementation.  Iron deficiency anemia affects a large proportion of the wellness population especially females of childbearing age and children.  Common causes of iron deficiency include blood loss, reduced iron absorption because of previous surgeries, dietary restrictions or other malabsorption issues, medications that reduce gastric acidity are due to inherited disorders such as IRIDA due to TMPRSS6 mutation. Symptoms of iron deficiency usually include fatigue, pica, restless legs, exercise intolerance, exertional dyspnea, headaches and weakness. Diagnosis can be usually made by history, physical examination, CBC and iron studies.  We generally treat iron deficiency anemia with oral supplements if this can be tolerated.  IV iron is appropriate for patients are unable to tolerate due to gastrointestinal side effects or for patients with severe/ongoing blood loss, history of gastric bypass which reduces gastric acid and henceforth will impair intestinal absorption of oral iron, malabsorption syndromes are occasional in pregnancy. In this patient it is reasonable to consider IV iron since she had severe anemia requiring blood transfusion.  Will plan to repeat CBC, iron panel and ferritin today.  There are several formularies of intravenous iron available in the market.  We have discussed about risk of allergic/infusion reactions including potentially life-threatening anaphylaxis with intravenous iron however these serious allergic reactions are exceedingly rare and overestimated.  In contrast to serious allergic reactions, IV iron may be associated with nonallergic infusion reactions including self-limited urticaria, palpitations, dizziness, neck and back spasm which again  occur in less than 1% of the individuals and do not progress to more serious reactions.  She is willing to proceed with IV iron.  Venofer therapy plan placed.  She will return to clinic in about 3 months with repeat labs.

## 2022-01-11 NOTE — Progress Notes (Addendum)
[facility] CONSULT NOTE  Patient Care Team: Pcp, No as PCP - General  CHIEF COMPLAINTS/PURPOSE OF CONSULTATION:  IDA   ASSESSMENT & PLAN:  IDA (iron deficiency anemia) This is a very pleasant 46 year old female patient with past medical history significant for hypertension, iron deficiency secondary to menorrhagia referred to hematology for additional recommendations.  She is currently on oral iron supplementation.  Iron deficiency anemia affects a large proportion of the wellness population especially females of childbearing age and children.  Common causes of iron deficiency include blood loss, reduced iron absorption because of previous surgeries, dietary restrictions or other malabsorption issues, medications that reduce gastric acidity are due to inherited disorders such as IRIDA due to TMPRSS6 mutation. Symptoms of iron deficiency usually include fatigue, pica, restless legs, exercise intolerance, exertional dyspnea, headaches and weakness. Diagnosis can be usually made by history, physical examination, CBC and iron studies.  We generally treat iron deficiency anemia with oral supplements if this can be tolerated.  IV iron is appropriate for patients are unable to tolerate due to gastrointestinal side effects or for patients with severe/ongoing blood loss, history of gastric bypass which reduces gastric acid and henceforth will impair intestinal absorption of oral iron, malabsorption syndromes are occasional in pregnancy. In this patient it is reasonable to consider IV iron since she had severe anemia requiring blood transfusion.  Will plan to repeat CBC, iron panel and ferritin today.  There are several formularies of intravenous iron available in the market.  We have discussed about risk of allergic/infusion reactions including potentially life-threatening anaphylaxis with intravenous iron however these serious allergic reactions are exceedingly rare and overestimated.  In  contrast to serious allergic reactions, IV iron may be associated with nonallergic infusion reactions including self-limited urticaria, palpitations, dizziness, neck and back spasm which again occur in less than 1% of the individuals and do not progress to more serious reactions.  She is willing to proceed with IV iron.  Venofer therapy plan placed.  She will return to clinic in about 3 months with repeat labs.     I placed a referral to genetics as well since she has family history of breast cancer in her sister at the age of 61, maternal aunt, maternal grandmother and several other family members.  I spoke to the sister who mentioned that she did not test positive for BRCA but she does not know if she had extended genetic panel testing.  Addendum: We called the patient and reviewed labs which showed complete correction of anemia and iron deficiency. No indication for iron infusion. Sent an inbasket to scheduling to cancel iron infusion  Orders Placed This Encounter  Procedures   CBC with Differential/Platelet    Standing Status:   Standing    Number of Occurrences:   22    Standing Expiration Date:   01/12/2023   Iron and Iron Binding Capacity ([facility] only)    Standing Status:   Future    Number of Occurrences:   1    Standing Expiration Date:   01/12/2023   Ferritin    Standing Status:   Future    Number of Occurrences:   1    Standing Expiration Date:   01/11/2023     HISTORY OF PRESENTING ILLNESS:  Sara Erickson 46 y.o. female is here because of severe IDA  This is a very pleasant 46 year old female patient with severe anemia, hemoglobin of 6.2 g/dL, microcytic, hypochromic secondary to iron deficiency from heavy menstrual cycles  referred to hematology for additional recommendations. Sara Erickson is here for an initial visit.  She was recently seen in the emergency room for severe anemia, required blood transfusion.  She is now on oral iron supplementation, takes elemental  iron 150 mg daily.  She has noticed severe fatigue, shortness of breath with exertion, dizziness as well as pica.  Since she started taking oral iron for the past 4 to 5 weeks, she has noticed resolution of pica.  She still feels very fatigued overall.  She has heavy menstrual cycles which last for almost 10 days every month and is hoping to have endometrial ablation done once her anemia improves.  She is otherwise healthy, denies any hematochezia or melena.  No family history of hematological disorders.  She is very compliant with her antihypertensive medication.  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  Rest of the pertinent 10 point ROS reviewed and negative.   REVIEW OF SYSTEMS:   Constitutional: Denies fevers, chills or abnormal night sweats Eyes: Denies blurriness of vision, double vision or watery eyes Ears, nose, mouth, throat, and face: Denies mucositis or sore throat Respiratory: Denies cough, dyspnea or wheezes Cardiovascular: Denies palpitation, chest discomfort or lower extremity swelling Gastrointestinal:  Denies nausea, heartburn or change in bowel habits Skin: Denies abnormal skin rashes Lymphatics: Denies new lymphadenopathy or easy bruising Neurological:Denies numbness, tingling or new weaknesses Behavioral/Psych: Mood is stable, no new changes  All other systems were reviewed with the patient and are negative.  MEDICAL HISTORY:  Past Medical History:  Diagnosis Date   Hypertension    Murmur 09/17/2014   Obesity (BMI 30.0-34.9) 09/17/2014    SURGICAL HISTORY: No past surgical history on file.  SOCIAL HISTORY: Social History   Socioeconomic History   Marital status: Single    Spouse name: Not on file   Number of children: Not on file   Years of education: Not on file   Highest education level: Not on file  Occupational History   Not on file  Tobacco Use   Smoking status: Never   Smokeless tobacco: Never  Substance and  Sexual Activity   Alcohol use: No   Drug use: No   Sexual activity: Never  Other Topics Concern   Not on file  Social History Narrative   Not on file   Social Determinants of Health   Financial Resource Strain: Not on file  Food Insecurity: Not on file  Transportation Needs: Not on file  Physical Activity: Not on file  Stress: Not on file  Social Connections: Not on file  Intimate Partner Violence: Not on file    FAMILY HISTORY: Family History  Problem Relation Age of Onset   Diabetes Mother    Diabetes Father    Hypertension Mother    Hypertension Father    Cancer Sister        breast   Cancer Sister        lung    ALLERGIES:  has No Known Allergies.  MEDICATIONS:  Current Outpatient Medications  Medication Sig Dispense Refill   ferrous sulfate 325 (65 FE) MG tablet Take 1 tablet (325 mg total) by mouth daily. 30 tablet 0   ibuprofen (ADVIL,MOTRIN) 200 MG tablet Take 200 mg by mouth every 6 (six) hours as needed for headache or mild pain.     losartan-hydrochlorothiazide (HYZAAR) 50-12.5 MG tablet Take 1 tablet by mouth daily. 30 tablet 1   TYLENOL 325  MG tablet Take 325 mg by mouth every 6 (six) hours as needed for mild pain or headache.     No current facility-administered medications for this visit.     PHYSICAL EXAMINATION: ECOG PERFORMANCE STATUS: 0 - Asymptomatic  Vitals:   01/11/22 0923  BP: (!) 147/92  Pulse: 64  Resp: 16  Temp: 98.1 F (36.7 C)  SpO2: 99%   Filed Weights   01/11/22 0923  Weight: 206 lb 11.2 oz (93.8 kg)    GENERAL:alert, no distress and comfortable SKIN: skin color, texture, turgor are normal, no rashes or significant lesions EYES: normal, conjunctiva are pink and non-injected, sclera clear OROPHARYNX:no exudate, no erythema and lips, buccal mucosa, and tongue normal  NECK: supple, thyroid normal size, non-tender, without nodularity LYMPH:  no palpable lymphadenopathy in the cervical, axillary LUNGS: clear to  auscultation and percussion with normal breathing effort HEART: regular rate & rhythm and no murmurs and no lower extremity edema ABDOMEN:abdomen soft, non-tender and normal bowel sounds Musculoskeletal:no cyanosis of digits and no clubbing  PSYCH: alert & oriented x 3 with fluent speech NEURO: no focal motor/sensory deficits  LABORATORY DATA:  I have reviewed the data as listed Lab Results  Component Value Date   WBC 9.5 01/11/2022   HGB 12.0 01/11/2022   HCT 37.3 01/11/2022   MCV 83.3 01/11/2022   PLT 449 (H) 01/11/2022     Chemistry      Component Value Date/Time   NA 138 12/02/2021 1548   K 3.7 12/02/2021 1548   CL 109 12/02/2021 1548   CO2 24 12/02/2021 1548   BUN 13 12/02/2021 1548   CREATININE 0.95 12/02/2021 1548      Component Value Date/Time   CALCIUM 8.3 (L) 12/02/2021 1548   ALKPHOS 51 12/02/2021 1548   AST 30 12/02/2021 1548   ALT 17 12/02/2021 1548   BILITOT 0.4 12/02/2021 1548       RADIOGRAPHIC STUDIES: I have personally reviewed the radiological images as listed and agreed with the findings in the report. No results found.  All questions were answered. The patient knows to call the clinic with any problems, questions or concerns. I spent 45 minutes in the care of this patient including H and P, review of records, counseling and coordination of care.     Benay Pike, MD 01/11/2022 10:37 AM

## 2022-01-18 ENCOUNTER — Encounter (HOSPITAL_BASED_OUTPATIENT_CLINIC_OR_DEPARTMENT_OTHER): Payer: Self-pay | Admitting: Obstetrics and Gynecology

## 2022-01-18 NOTE — Progress Notes (Signed)
Spoke w/ via phone for pre-op interview--- Sara Erickson Lab needs dos---- UPT, and ISTAT per anesthesia, surgeon orders pending.              Lab results------ Current EKG dated 09/2021 in Epic. COVID test -----patient states asymptomatic no test needed Arrive at -------0950 NPO after MN NO Solid Food.  Clear liquids from MN until---0850 Med rec completed Medications to take morning of surgery -----NONE Diabetic medication ----- Patient instructed no nail polish to be worn day of surgery Patient instructed to bring photo id and insurance card day of surgery Patient aware to have Driver (ride ) / caregiver  Patsy Lager -Sister  for 24 hours after surgery  Patient Special Instructions ----- Pre-Op special Istructions ----- Patient verbalized understanding of instructions that were given at this phone interview. Patient denies shortness of breath, chest pain, fever, cough at this phone interview.

## 2022-01-19 ENCOUNTER — Other Ambulatory Visit: Payer: Self-pay | Admitting: Obstetrics and Gynecology

## 2022-01-20 ENCOUNTER — Other Ambulatory Visit: Payer: Self-pay

## 2022-01-20 ENCOUNTER — Ambulatory Visit (HOSPITAL_BASED_OUTPATIENT_CLINIC_OR_DEPARTMENT_OTHER)
Admission: RE | Admit: 2022-01-20 | Discharge: 2022-01-20 | Disposition: A | Discharge status: Home / Self Care | Payer: 59 | Attending: Obstetrics and Gynecology | Admitting: Obstetrics and Gynecology

## 2022-01-20 ENCOUNTER — Ambulatory Visit (HOSPITAL_BASED_OUTPATIENT_CLINIC_OR_DEPARTMENT_OTHER): Payer: 59 | Admitting: Anesthesiology

## 2022-01-20 ENCOUNTER — Encounter (HOSPITAL_BASED_OUTPATIENT_CLINIC_OR_DEPARTMENT_OTHER): Payer: Self-pay | Admitting: Obstetrics and Gynecology

## 2022-01-20 ENCOUNTER — Encounter (HOSPITAL_BASED_OUTPATIENT_CLINIC_OR_DEPARTMENT_OTHER)
Admission: RE | Disposition: A | Discharge status: Home / Self Care | Payer: Self-pay | Source: Home / Self Care | Attending: Obstetrics and Gynecology

## 2022-01-20 DIAGNOSIS — E669 Obesity, unspecified: Secondary | ICD-10-CM | POA: Insufficient documentation

## 2022-01-20 DIAGNOSIS — Z6830 Body mass index (BMI) 30.0-30.9, adult: Secondary | ICD-10-CM | POA: Diagnosis not present

## 2022-01-20 DIAGNOSIS — R011 Cardiac murmur, unspecified: Secondary | ICD-10-CM | POA: Insufficient documentation

## 2022-01-20 DIAGNOSIS — Z79899 Other long term (current) drug therapy: Secondary | ICD-10-CM | POA: Insufficient documentation

## 2022-01-20 DIAGNOSIS — N921 Excessive and frequent menstruation with irregular cycle: Secondary | ICD-10-CM | POA: Insufficient documentation

## 2022-01-20 DIAGNOSIS — D509 Iron deficiency anemia, unspecified: Secondary | ICD-10-CM | POA: Insufficient documentation

## 2022-01-20 DIAGNOSIS — I10 Essential (primary) hypertension: Secondary | ICD-10-CM | POA: Diagnosis not present

## 2022-01-20 DIAGNOSIS — Z01818 Encounter for other preprocedural examination: Secondary | ICD-10-CM

## 2022-01-20 HISTORY — DX: Anemia, unspecified: D64.9

## 2022-01-20 HISTORY — PX: HYSTEROSCOPY WITH NOVASURE: SHX5574

## 2022-01-20 LAB — CBC
HCT: 41.1 % (ref 36.0–46.0)
Hemoglobin: 12.7 g/dL (ref 12.0–15.0)
MCH: 26.3 pg (ref 26.0–34.0)
MCHC: 30.9 g/dL (ref 30.0–36.0)
MCV: 85.3 fL (ref 80.0–100.0)
Platelets: 394 10*3/uL (ref 150–400)
RBC: 4.82 MIL/uL (ref 3.87–5.11)
RDW: 26.5 % — ABNORMAL HIGH (ref 11.5–15.5)
WBC: 8.5 10*3/uL (ref 4.0–10.5)
nRBC: 0 % (ref 0.0–0.2)

## 2022-01-20 LAB — POCT I-STAT, CHEM 8
BUN: 13 mg/dL (ref 6–20)
Calcium, Ion: 1.08 mmol/L — ABNORMAL LOW (ref 1.15–1.40)
Chloride: 109 mmol/L (ref 98–111)
Creatinine, Ser: 0.8 mg/dL (ref 0.44–1.00)
Glucose, Bld: 97 mg/dL (ref 70–99)
HCT: 42 % (ref 36.0–46.0)
Hemoglobin: 14.3 g/dL (ref 12.0–15.0)
Potassium: 4.4 mmol/L (ref 3.5–5.1)
Sodium: 141 mmol/L (ref 135–145)
TCO2: 22 mmol/L (ref 22–32)

## 2022-01-20 LAB — POCT PREGNANCY, URINE: Preg Test, Ur: NEGATIVE

## 2022-01-20 SURGERY — HYSTEROSCOPY WITH NOVASURE
Anesthesia: General | Site: Vagina

## 2022-01-20 MED ORDER — KETOROLAC TROMETHAMINE 30 MG/ML IJ SOLN
INTRAMUSCULAR | Status: DC | PRN
  Administered 2022-01-20: 15 mg via INTRAVENOUS

## 2022-01-20 MED ORDER — LACTATED RINGERS IV SOLN: INTRAVENOUS | Status: DC

## 2022-01-20 MED ORDER — FENTANYL CITRATE (PF) 100 MCG/2ML IJ SOLN
INTRAMUSCULAR | Status: DC | PRN
  Administered 2022-01-20: 100 ug via INTRAVENOUS
  Administered 2022-01-20: 25 ug via INTRAVENOUS

## 2022-01-20 MED ORDER — POVIDONE-IODINE 10 % EX SWAB: 2.0000 | Freq: Once | CUTANEOUS | Status: DC

## 2022-01-20 MED ORDER — PROPOFOL 10 MG/ML IV BOLUS
INTRAVENOUS | Status: DC | PRN
  Administered 2022-01-20: 50 mg via INTRAVENOUS
  Administered 2022-01-20: 200 mg via INTRAVENOUS

## 2022-01-20 MED ORDER — KETOROLAC TROMETHAMINE 30 MG/ML IJ SOLN
INTRAMUSCULAR | Status: AC
  Filled 2022-01-20: qty 4

## 2022-01-20 MED ORDER — LIDOCAINE 2% (20 MG/ML) 5 ML SYRINGE
INTRAMUSCULAR | Status: DC | PRN
  Administered 2022-01-20: 60 mg via INTRAVENOUS

## 2022-01-20 MED ORDER — OXYCODONE HCL 5 MG/5ML PO SOLN: 5.0000 mg | Freq: Once | ORAL | Status: AC | PRN

## 2022-01-20 MED ORDER — LIDOCAINE HCL (PF) 2 % IJ SOLN
INTRAMUSCULAR | Status: AC
  Filled 2022-01-20: qty 5

## 2022-01-20 MED ORDER — PROPOFOL 500 MG/50ML IV EMUL
INTRAVENOUS | Status: AC
  Filled 2022-01-20: qty 50

## 2022-01-20 MED ORDER — OXYCODONE HCL 5 MG PO TABS
5.0000 mg | ORAL_TABLET | Freq: Once | ORAL | Status: AC | PRN
  Administered 2022-01-20: 5 mg via ORAL

## 2022-01-20 MED ORDER — SODIUM CHLORIDE 0.9 % IR SOLN
Status: DC | PRN
  Administered 2022-01-20: 3000 mL

## 2022-01-20 MED ORDER — HYDROMORPHONE HCL 1 MG/ML IJ SOLN: 0.2500 mg | INTRAMUSCULAR | Status: DC | PRN

## 2022-01-20 MED ORDER — DEXAMETHASONE SODIUM PHOSPHATE 10 MG/ML IJ SOLN
INTRAMUSCULAR | Status: AC
  Filled 2022-01-20: qty 2

## 2022-01-20 MED ORDER — DEXAMETHASONE SODIUM PHOSPHATE 10 MG/ML IJ SOLN
INTRAMUSCULAR | Status: DC | PRN
  Administered 2022-01-20: 5 mg via INTRAVENOUS

## 2022-01-20 MED ORDER — ACETAMINOPHEN 500 MG PO TABS
1000.0000 mg | ORAL_TABLET | Freq: Once | ORAL | Status: AC
  Administered 2022-01-20: 1000 mg via ORAL

## 2022-01-20 MED ORDER — ONDANSETRON HCL 4 MG/2ML IJ SOLN
INTRAMUSCULAR | Status: DC | PRN
  Administered 2022-01-20: 4 mg via INTRAVENOUS

## 2022-01-20 MED ORDER — MIDAZOLAM HCL 2 MG/2ML IJ SOLN
INTRAMUSCULAR | Status: AC
  Filled 2022-01-20: qty 2

## 2022-01-20 MED ORDER — IBUPROFEN 800 MG PO TABS: 800.0000 mg | ORAL_TABLET | Freq: Three times a day (TID) | ORAL | 11 refills | Status: DC | PRN

## 2022-01-20 MED ORDER — ONDANSETRON HCL 4 MG/2ML IJ SOLN
INTRAMUSCULAR | Status: AC
  Filled 2022-01-20: qty 2

## 2022-01-20 MED ORDER — PHENYLEPHRINE 80 MCG/ML (10ML) SYRINGE FOR IV PUSH (FOR BLOOD PRESSURE SUPPORT)
PREFILLED_SYRINGE | INTRAVENOUS | Status: AC
  Filled 2022-01-20: qty 10

## 2022-01-20 MED ORDER — AMISULPRIDE (ANTIEMETIC) 5 MG/2ML IV SOLN: 10.0000 mg | Freq: Once | INTRAVENOUS | Status: DC | PRN

## 2022-01-20 MED ORDER — EPHEDRINE SULFATE-NACL 50-0.9 MG/10ML-% IV SOSY
PREFILLED_SYRINGE | INTRAVENOUS | Status: DC | PRN
  Administered 2022-01-20 (×3): 5 mg via INTRAVENOUS

## 2022-01-20 MED ORDER — MEPERIDINE HCL 25 MG/ML IJ SOLN: 6.2500 mg | INTRAMUSCULAR | Status: DC | PRN

## 2022-01-20 MED ORDER — DEXMEDETOMIDINE HCL IN NACL 200 MCG/50ML IV SOLN
INTRAVENOUS | Status: DC | PRN
  Administered 2022-01-20: 8 ug via INTRAVENOUS

## 2022-01-20 MED ORDER — MIDAZOLAM HCL 5 MG/5ML IJ SOLN
INTRAMUSCULAR | Status: DC | PRN
  Administered 2022-01-20: 2 mg via INTRAVENOUS

## 2022-01-20 MED ORDER — KETOROLAC TROMETHAMINE 30 MG/ML IJ SOLN: 30.0000 mg | Freq: Once | INTRAMUSCULAR | Status: DC | PRN

## 2022-01-20 MED ORDER — FENTANYL CITRATE (PF) 100 MCG/2ML IJ SOLN
INTRAMUSCULAR | Status: AC
  Filled 2022-01-20: qty 2

## 2022-01-20 MED ORDER — ONDANSETRON HCL 4 MG/2ML IJ SOLN: 4.0000 mg | Freq: Once | INTRAMUSCULAR | Status: DC | PRN

## 2022-01-20 MED ORDER — OXYCODONE HCL 5 MG PO TABS
ORAL_TABLET | ORAL | Status: AC
  Filled 2022-01-20: qty 1

## 2022-01-20 MED ORDER — ACETAMINOPHEN 500 MG PO TABS
ORAL_TABLET | ORAL | Status: AC
  Filled 2022-01-20: qty 2

## 2022-01-20 SURGICAL SUPPLY — 21 items
ABLATOR SURESOUND NOVASURE (ABLATOR) IMPLANT
CATH ROBINSON RED A/P 16FR (CATHETERS) IMPLANT
DEVICE MYOSURE REACH (MISCELLANEOUS) IMPLANT
DILATOR CANAL MILEX (MISCELLANEOUS) IMPLANT
DRSG TELFA 3X8 NADH STRL (GAUZE/BANDAGES/DRESSINGS) ×2 IMPLANT
GAUZE 4X4 16PLY ~~LOC~~+RFID DBL (SPONGE) ×4 IMPLANT
GAUZE PETROLATUM 1 X8 (GAUZE/BANDAGES/DRESSINGS) IMPLANT
GLOVE BIOGEL PI IND STRL 7.0 (GLOVE) ×2 IMPLANT
GLOVE ECLIPSE 6.5 STRL STRAW (GLOVE) ×2 IMPLANT
GOWN STRL REUS W/TWL LRG LVL3 (GOWN DISPOSABLE) ×2 IMPLANT
IV NS IRRIG 3000ML ARTHROMATIC (IV SOLUTION) ×2 IMPLANT
KIT PROCEDURE FLUENT (KITS) ×2 IMPLANT
KIT TURNOVER CYSTO (KITS) ×2 IMPLANT
LOOP CUTTING BIPOLAR 21FR (ELECTRODE) IMPLANT
PACK VAGINAL MINOR WOMEN LF (CUSTOM PROCEDURE TRAY) ×2 IMPLANT
PAD OB MATERNITY 4.3X12.25 (PERSONAL CARE ITEMS) ×2 IMPLANT
PAD PREP 24X48 CUFFED NSTRL (MISCELLANEOUS) ×2 IMPLANT
SEAL CERVICAL OMNI LOK (ABLATOR) IMPLANT
SEAL ROD LENS SCOPE MYOSURE (ABLATOR) ×4 IMPLANT
TOWEL OR 17X26 10 PK STRL BLUE (TOWEL DISPOSABLE) ×2 IMPLANT
WATER STERILE IRR 500ML POUR (IV SOLUTION) ×2 IMPLANT

## 2022-01-20 NOTE — Anesthesia Postprocedure Evaluation (Signed)
Anesthesia Post Note  Patient: Sara Erickson  Procedure(s) Performed: DIAGNOSTIC HYSTEROSCOPY WITH POLYPECTOMY;  NOVASURE ENDOMETRIAL ABLATION (Vagina )     Patient location during evaluation: PACU Anesthesia Type: General Level of consciousness: awake and alert, oriented and patient cooperative Pain management: pain level controlled Vital Signs Assessment: post-procedure vital signs reviewed and stable Respiratory status: spontaneous breathing, nonlabored ventilation and respiratory function stable Cardiovascular status: blood pressure returned to baseline and stable Postop Assessment: no apparent nausea or vomiting Anesthetic complications: no  No notable events documented.  Last Vitals:  Vitals:   01/20/22 1245 01/20/22 1300  BP: 138/73 (!) 143/91  Pulse: 83 73  Resp: 13 15  Temp:    SpO2: 98% 98%    Last Pain:  Vitals:   01/20/22 1245  TempSrc:   PainSc: 0-No pain                 Lannie Fields

## 2022-01-20 NOTE — Op Note (Unsigned)
NAME: Sara Erickson, KHAMIS MEDICAL RECORD NO: [id number] ACCOUNT NO: [id number] DATE OF BIRTH: Nov 25, 1975 FACILITY: [facility] LOCATION: [facility] PHYSICIAN: Milind Raether A. Cherly Hensen, MD  Operative Report   DATE OF PROCEDURE: 01/20/2022   PREOPERATIVE DIAGNOSES:  Menometrorrhagia, iron deficiency anemia.  POSTOPERATIVE DIAGNOSIS:  Menometrorrhagia, iron deficiency anemia.   PROCEDURE:  Diagnostic hysteroscopy, hysteroscopic resection of endometrial polyp, NovaSure endometrial ablation, Dilation and Curettage.  ANESTHESIA:  General.  SURGEON:  Jorgina Binning A. Cherly Hensen, MD.  ASSISTANT:  None.  DESCRIPTION OF PROCEDURE:  Under adequate general anesthesia, the patient was placed in the dorsal lithotomy position.  She was sterilely prepped and draped in the usual fashion.  The patient had voided prior to entering the room, therefore she was not  catheterized.  Bivalve speculum was placed in the vagina, parous cervix was noted.  A single-tooth tenaculum was placed on the anterior lip of the cervix.  The uterus was then sounded to 8 cm.  A very parous os again was noted.  Cervical length was noted  to be 3 cm.  A diagnostic hysteroscope with the Omni Lok was inserted.  The entire cavity was diffuse thickening with polypoid lesions.  The tubal ostia could not be seen.  Using the Reach resectoscope, the endometrial cavity was resected to facilitate  the ablation.  Once that was done, the hysteroscope was removed.  The NovaSure endometrial ablation apparatus was inserted, a cervical width of 3.9 cm was found and with a power of 110 watts for a time of 1 minute and 16 seconds, the endometrium was  ablated.  The apparatus was then removed.  The diagnostic hysteroscope was then reinserted.  Good ablation was noted throughout. The procedure was felt to be complete at which time, all instruments were removed from the vagina.  Specimen labeled  endometrial curettings with polyps was sent to pathology.  Estimated  blood loss 5 mL. Complication was none.  Fluid deficit: 300 mL.  COUNTS:  Sponge and instrument counts x2 was correct.  COMPLICATIONS:  None.  The patient tolerated the procedure well, was transferred to recovery room in stable condition.   SUJ D: 01/20/2022 1:00:29 pm T: 01/20/2022 10:42:00 pm  JOB: [id number]/ [id number]

## 2022-01-20 NOTE — H&P (Signed)
Sara Erickson is an 46 y.o. female. G6P4 BF hx TL presents for surgical management of menorrhagia. Pt has failed hormonal therapy. EBX benign pathology. Pt is scheduled for diagnostic hysteroscopy, hysteroscopic resection , Novasure endometrial ablation  Pertinent Gynecological History: Menses: flow is excessive with use of 6 pads or tampons on heaviest days Bleeding: menorrhagia Contraception: tubal ligation DES exposure: denies Blood transfusions:  PRBC Sexually transmitted diseases: no past history Previous GYN Procedures:  tl   Last mammogram:  unsure  Date: n/a Last pap: normal Date: 2023 OB History: G6, P4   Menstrual History: Menarche age: n/a Patient's last menstrual period was 12/07/2021 (approximate).    Past Medical History:  Diagnosis Date   Anemia    Hypertension    Murmur 09/17/2014   Obesity (BMI 30.0-34.9) 09/17/2014    Past Surgical History:  Procedure Laterality Date   LUMBAR DISC SURGERY  2017    Family History  Problem Relation Age of Onset   Diabetes Mother    Diabetes Father    Hypertension Mother    Hypertension Father    Cancer Sister        breast   Cancer Sister        lung    Social History:  reports that she has never smoked. She has never used smokeless tobacco. She reports that she does not drink alcohol and does not use drugs.  Allergies: No Known Allergies  No medications prior to admission.    Review of Systems  All other systems reviewed and are negative.   Height 5\' 9"  (1.753 m), weight 93.4 kg, last menstrual period 12/07/2021. Physical Exam Constitutional:      Appearance: Normal appearance.  HENT:     Head: Atraumatic.     Mouth/Throat:     Mouth: Mucous membranes are moist.  Eyes:     Extraocular Movements: Extraocular movements intact.  Cardiovascular:     Rate and Rhythm: Regular rhythm.     Heart sounds: Normal heart sounds.  Pulmonary:     Breath sounds: Normal breath sounds.  Abdominal:      Palpations: Abdomen is soft.  Genitourinary:    General: Normal vulva.     Comments: Vagina no lesion Cervix parous Uterus AV 10 wk size Adnexa no palpable mass Musculoskeletal:        General: Normal range of motion.     Cervical back: Neck supple.  Neurological:     Mental Status: She is alert and oriented to person, place, and time.  Psychiatric:        Mood and Affect: Mood normal.        Behavior: Behavior normal.     No results found for this or any previous visit (from the past 24 hour(s)).  No results found.  Assessment/Plan: Menometrorrhagia  Chronic HTN P) dx hysteroscopy, hysteroscopic resection using myosure, Novasure endometrial ablation. Risk of surgery reviewed including infection, bleeding, uterine perforation( 02/998) and its risk, thermal injury, 90% reduction in flow, fluid overload and its mgmt. All ? answered  Abdulahad Mederos A Katianne Barre 01/20/2022, 3:33 AM

## 2022-01-20 NOTE — Anesthesia Procedure Notes (Signed)
Procedure Name: LMA Insertion Date/Time: 01/20/2022 11:54 AM  Performed by: Bishop Limbo, CRNAPre-anesthesia Checklist: Patient identified, Emergency Drugs available, Suction available and Patient being monitored Patient Re-evaluated:Patient Re-evaluated prior to induction Oxygen Delivery Method: Circle System Utilized Preoxygenation: Pre-oxygenation with 100% oxygen Induction Type: IV induction Ventilation: Mask ventilation without difficulty LMA: LMA inserted LMA Size: 4.0 Number of attempts: 1 Placement Confirmation: positive ETCO2 Tube secured with: Tape Dental Injury: Teeth and Oropharynx as per pre-operative assessment

## 2022-01-20 NOTE — Anesthesia Preprocedure Evaluation (Addendum)
Anesthesia Evaluation  Patient identified by MRN, date of birth, ID band Patient awake    Reviewed: Allergy & Precautions, NPO status , Patient's Chart, lab work & pertinent test results  Airway Mallampati: II  TM Distance: >3 FB Neck ROM: Full    Dental  (+) Teeth Intact, Dental Advisory Given   Pulmonary  Pt coughing in preop, no sick contacts- states she woke up feeling somewhat congested and with dry cough which she thinks is due to sleeping w/ ceiling fan on Denies any fevers or productive cough   Pulmonary exam normal breath sounds clear to auscultation       Cardiovascular hypertension, Pt. on medications + Valvular Problems/Murmurs  Rhythm:Regular Rate:Normal + Systolic murmurs Per pt, Dr. Cherly Hensen discovered heart murmur and referred her to cardiology but she did not follow up. Mild systolic murmur heard on exam  Blood pressure poorly controlled historically, was started on BP meds and is still usually 150-170 SBP. 150/84 in preop today   Neuro/Psych negative neurological ROS  negative psych ROS   GI/Hepatic negative GI ROS, Neg liver ROS,,,  Endo/Other  Obesity BMI 30  Renal/GU negative Renal ROS  Female GU complaint     Musculoskeletal negative musculoskeletal ROS (+)    Abdominal  (+) + obese  Peds  Hematology negative hematology ROS (+) Hb 12   Anesthesia Other Findings   Reproductive/Obstetrics negative OB ROS                             Anesthesia Physical Anesthesia Plan  ASA: 3  Anesthesia Plan: General   Post-op Pain Management: Tylenol PO (pre-op)* and Toradol IV (intra-op)*   Induction: Intravenous  PONV Risk Score and Plan: 4 or greater and Ondansetron, Dexamethasone, Midazolam and Treatment may vary due to age or medical condition  Airway Management Planned: LMA  Additional Equipment: None  Intra-op Plan:   Post-operative Plan: Extubation in  OR  Informed Consent: I have reviewed the patients History and Physical, chart, labs and discussed the procedure including the risks, benefits and alternatives for the proposed anesthesia with the patient or authorized representative who has indicated his/her understanding and acceptance.     Dental advisory given  Plan Discussed with: CRNA  Anesthesia Plan Comments:        Anesthesia Quick Evaluation

## 2022-01-20 NOTE — Interval H&P Note (Signed)
History and Physical Interval Note:  01/20/2022 11:43 AM  Sara Erickson  has presented today for surgery, with the diagnosis of menometrorrhagia.  The various methods of treatment have been discussed with the patient and family. After consideration of risks, benefits and other options for treatment, the patient has consented to  Procedure(s): DIAGNOSTIC HYSTEROSCOPY WITH POLYPECTOMY;  NOVASURE ENDOMETRIAL ABLATION (N/A) as a surgical intervention.  The patient's history has been reviewed, patient examined, no change in status, stable for surgery.  I have reviewed the patient's chart and labs.  Questions were answered to the patient's satisfaction.     Balin Vandegrift A Brayen Bunn

## 2022-01-20 NOTE — Transfer of Care (Signed)
Immediate Anesthesia Transfer of Care Note  Patient: Sara Erickson  Procedure(s) Performed: DIAGNOSTIC HYSTEROSCOPY WITH POLYPECTOMY;  NOVASURE ENDOMETRIAL ABLATION (Vagina )  Patient Location: PACU  Anesthesia Type:General  Level of Consciousness: drowsy, patient cooperative, and responds to stimulation  Airway & Oxygen Therapy: Patient Spontanous Breathing  Post-op Assessment: Report given to RN and Post -op Vital signs reviewed and stable  Post vital signs: Reviewed and stable  Last Vitals:  Vitals Value Taken Time  BP 138/70 01/20/22 1239  Temp 37.1 C 01/20/22 1237  Pulse 85 01/20/22 1242  Resp 14 01/20/22 1242  SpO2 98 % 01/20/22 1242  Vitals shown include unvalidated device data.  Last Pain:  Vitals:   01/20/22 0947  TempSrc: Oral  PainSc: 5       Patients Stated Pain Goal: 5 (01/20/22 0947)  Complications: No notable events documented.

## 2022-01-20 NOTE — Discharge Instructions (Addendum)
CALL  IF TEMP>100.4, NOTHING PER VAGINA X 2 WK, CALL IF SOAKING A MAXI  PAD EVERY HOUR OR MORE FREQUENTLY   No acetaminophen/Tylenol until after 3:50pm today if needed.    No ibuprofen, Advil, Aleve, Motrin, ketorolac, meloxicam, naproxen, or other NSAIDS until after 6:10 pm today if needed.        DISCHARGE INSTRUCTIONS: HYSTEROSCOPY / ENDOMETRIAL ABLATION    The following instructions have been prepared to help you care for yourself upon your return home.  May take stool softner while taking narcotic pain medication to prevent constipation.  Drink plenty of water.  Personal hygiene:  Use sanitary pads for vaginal drainage, not tampons.  Shower the day after your procedure.  NO tub baths, pools or Jacuzzis for 2-3 weeks.  Wipe front to back after using the bathroom.  Activity and limitations:  Do NOT drive or operate any equipment for 24 hours. The effects of anesthesia are still present and drowsiness may result.  Do NOT rest in bed all day.  Walking is encouraged.  Walk up and down stairs slowly.  You may resume your normal activity in one to two days or as indicated by your physician. Sexual activity: NO intercourse for at least 2 weeks after the procedure, or as indicated by your Doctor.  Diet: Eat a light meal as desired this evening. You may resume your usual diet tomorrow.  Return to Work: You may resume your work activities in one to two days or as indicated by Therapist, sports.  What to expect after your surgery: Expect to have vaginal bleeding/discharge for 2-3 days and spotting for up to 10 days. It is not unusual to have soreness for up to 1-2 weeks. You may have a slight burning sensation when you urinate for the first day. Mild cramps may continue for a couple of days. You may have a regular period in 2-6 weeks.  Call your doctor for any of the following:  Excessive vaginal bleeding or clotting, saturating and changing one pad every hour.  Inability to  urinate 6 hours after discharge from hospital.  Pain not relieved by pain medication.  Fever of 100.4 F or greater.  Unusual vaginal discharge or odor.        Post Anesthesia Home Care Instructions  Activity: Get plenty of rest for the remainder of the day. A responsible individual must stay with you for 24 hours following the procedure.  For the next 24 hours, DO NOT: -Drive a car -Advertising copywriter -Drink alcoholic beverages -Take any medication unless instructed by your physician -Make any legal decisions or sign important papers.  Meals: Start with liquid foods such as gelatin or soup. Progress to regular foods as tolerated. Avoid greasy, spicy, heavy foods. If nausea and/or vomiting occur, drink only clear liquids until the nausea and/or vomiting subsides. Call your physician if vomiting continues.  Special Instructions/Symptoms: Your throat may feel dry or sore from the anesthesia or the breathing tube placed in your throat during surgery. If this causes discomfort, gargle with warm salt water. The discomfort should disappear within 24 hours.

## 2022-01-20 NOTE — Brief Op Note (Signed)
01/20/2022  12:42 PM  PATIENT:  Sara Erickson  46 y.o. female  PRE-OPERATIVE DIAGNOSIS:  menometrorrhagia, IDA  POST-OPERATIVE DIAGNOSIS:  menometrorrhagia, IDA  PROCEDURE:  Diagnostic hysteroscopy, hysteroscopic resection of endometrial polyps, dilation and curettage, Novasure endometrial ablation  SURGEON:  Surgeon(s) and Role:    * Maxie Better, MD - Primary  PHYSICIAN ASSISTANT:   ASSISTANTS: none   ANESTHESIA:   general  EBL:  5 mL   BLOOD ADMINISTERED:none  DRAINS: none   LOCAL MEDICATIONS USED:  NONE  SPECIMEN:  Source of Specimen:  emc with polyps  DISPOSITION OF SPECIMEN:  PATHOLOGY  COUNTS:  YES  TOURNIQUET:  * No tourniquets in log *  DICTATION: .Other Dictation: Dictation Number [id number]  PLAN OF CARE: Discharge to home after PACU  PATIENT DISPOSITION:  PACU - hemodynamically stable.   Delay start of Pharmacological VTE agent (>24hrs) due to surgical blood loss or risk of bleeding: no

## 2022-01-23 LAB — SURGICAL PATHOLOGY

## 2022-01-24 ENCOUNTER — Encounter (HOSPITAL_BASED_OUTPATIENT_CLINIC_OR_DEPARTMENT_OTHER): Payer: Self-pay | Admitting: Obstetrics and Gynecology

## 2022-03-23 ENCOUNTER — Inpatient Hospital Stay: Payer: 59 | Attending: Hematology and Oncology

## 2022-03-23 ENCOUNTER — Inpatient Hospital Stay: Payer: 59 | Admitting: Genetic Counselor

## 2022-04-04 ENCOUNTER — Telehealth: Payer: Self-pay | Admitting: Hematology and Oncology

## 2022-04-04 NOTE — Telephone Encounter (Signed)
Called patient unable to leave a vm. Printed reminder sent in mail

## 2022-04-13 ENCOUNTER — Ambulatory Visit: Payer: 59 | Admitting: Hematology and Oncology

## 2022-04-25 ENCOUNTER — Other Ambulatory Visit: Payer: Self-pay

## 2022-04-25 ENCOUNTER — Inpatient Hospital Stay: Payer: 59 | Attending: Hematology and Oncology | Admitting: Hematology and Oncology

## 2022-04-25 ENCOUNTER — Inpatient Hospital Stay: Payer: 59

## 2022-04-25 VITALS — BP 147/83 | HR 69 | Temp 97.9°F | Resp 16 | Ht 69.0 in | Wt 221.7 lb

## 2022-04-25 DIAGNOSIS — N920 Excessive and frequent menstruation with regular cycle: Secondary | ICD-10-CM | POA: Insufficient documentation

## 2022-04-25 DIAGNOSIS — D500 Iron deficiency anemia secondary to blood loss (chronic): Secondary | ICD-10-CM

## 2022-04-25 LAB — CBC WITH DIFFERENTIAL/PLATELET
Abs Immature Granulocytes: 0.02 10*3/uL (ref 0.00–0.07)
Basophils Absolute: 0 10*3/uL (ref 0.0–0.1)
Basophils Relative: 1 %
Eosinophils Absolute: 0.6 10*3/uL — ABNORMAL HIGH (ref 0.0–0.5)
Eosinophils Relative: 7 %
HCT: 39.2 % (ref 36.0–46.0)
Hemoglobin: 13.6 g/dL (ref 12.0–15.0)
Immature Granulocytes: 0 %
Lymphocytes Relative: 32 %
Lymphs Abs: 2.5 10*3/uL (ref 0.7–4.0)
MCH: 31.2 pg (ref 26.0–34.0)
MCHC: 34.7 g/dL (ref 30.0–36.0)
MCV: 89.9 fL (ref 80.0–100.0)
Monocytes Absolute: 0.5 10*3/uL (ref 0.1–1.0)
Monocytes Relative: 7 %
Neutro Abs: 4.1 10*3/uL (ref 1.7–7.7)
Neutrophils Relative %: 53 %
Platelets: 240 10*3/uL (ref 150–400)
RBC: 4.36 MIL/uL (ref 3.87–5.11)
RDW: 14.6 % (ref 11.5–15.5)
WBC: 7.7 10*3/uL (ref 4.0–10.5)
nRBC: 0 % (ref 0.0–0.2)

## 2022-04-25 LAB — IRON AND IRON BINDING CAPACITY (CC-WL,HP ONLY)
Iron: 213 ug/dL — ABNORMAL HIGH (ref 28–170)
Saturation Ratios: 66 % — ABNORMAL HIGH (ref 10.4–31.8)
TIBC: 325 ug/dL (ref 250–450)
UIBC: 112 ug/dL — ABNORMAL LOW (ref 148–442)

## 2022-04-25 LAB — FERRITIN: Ferritin: 19 ng/mL (ref 11–307)

## 2022-04-25 NOTE — Progress Notes (Signed)
[facility] CONSULT NOTE  Patient Care Team: Pcp, No as PCP - General  CHIEF COMPLAINTS/PURPOSE OF CONSULTATION:  IDA   ASSESSMENT & PLAN:   This is a very pleasant 47 year old female patient with iron deficiency anemia status post complete correction recently had endometrial ablation, hysteroscopy with polypectomy and since then has not had to deal with heavy menstruation who is here for follow-up.  She has been feeling really well.  No more fatigue, shortness of breath on exertion or ice craving.  Physical examination unremarkable.  Will repeat CBC, iron panel and ferritin today.  If there is no further evidence of iron deficiency anemia, she can return to clinic as needed. Thank you for consulting [place] in the care of this patient.  Please do not hesitate to contact us with any additional questions or concerns.  HISTORY OF PRESENTING ILLNESS:  Sara Erickson 47 y.o. female is here because of severe IDA  This is a very pleasant 47 year old female patient with severe anemia, hemoglobin of 6.2 g/dL, microcytic, hypochromic secondary to iron deficiency from heavy menstrual cycles referred to hematology for additional recommendations. Since her last visit here she had diagnostic hysteroscopy with polypectomy and NovaSure endometrial ablation. She feels pretty good, no ice craving, energy is good. No SOB on exertion. She continues to be on iron pill once a day along with stool softener. No bleeding noted. No more menstrual cycles since the ablation, may be some spotting.  Rest of the pertinent 10 point ROS reviewed and negative.  MEDICAL HISTORY:  Past Medical History:  Diagnosis Date   Anemia    Hypertension    Murmur 09/17/2014   Obesity (BMI 30.0-34.9) 09/17/2014    SURGICAL HISTORY: Past Surgical History:  Procedure Laterality Date   HYSTEROSCOPY WITH NOVASURE N/A 01/20/2022   Procedure: DIAGNOSTIC HYSTEROSCOPY WITH POLYPECTOMY;  NOVASURE ENDOMETRIAL ABLATION;   Surgeon: Servando Salina, MD;  Location: [facility];  Service: Gynecology;  Laterality: N/A;   LUMBAR DISC SURGERY  2017    SOCIAL HISTORY: Social History   Socioeconomic History   Marital status: Single    Spouse name: Not on file   Number of children: Not on file   Years of education: Not on file   Highest education level: Not on file  Occupational History   Not on file  Tobacco Use   Smoking status: Never   Smokeless tobacco: Never  Substance and Sexual Activity   Alcohol use: No   Drug use: No   Sexual activity: Never  Other Topics Concern   Not on file  Social History Narrative   Not on file   Social Determinants of Health   Financial Resource Strain: Not on file  Food Insecurity: Not on file  Transportation Needs: Not on file  Physical Activity: Not on file  Stress: Not on file  Social Connections: Not on file  Intimate Partner Violence: Not on file    FAMILY HISTORY: Family History  Problem Relation Age of Onset   Diabetes Mother    Diabetes Father    Hypertension Mother    Hypertension Father    Cancer Sister        breast   Cancer Sister        lung    ALLERGIES:  has No Known Allergies.  MEDICATIONS:  Current Outpatient Medications  Medication Sig Dispense Refill   ferrous sulfate 325 (65 FE) MG tablet Take 1 tablet (325 mg total) by mouth daily. 30 tablet 0  ibuprofen (ADVIL) 800 MG tablet Take 1 tablet (800 mg total) by mouth every 8 (eight) hours as needed. 30 tablet 11   losartan-hydrochlorothiazide (HYZAAR) 50-12.5 MG tablet Take 1 tablet by mouth daily. 30 tablet 1   TYLENOL 325 MG tablet Take 325 mg by mouth every 6 (six) hours as needed for mild pain or headache.     No current facility-administered medications for this visit.     PHYSICAL EXAMINATION: ECOG PERFORMANCE STATUS: 0 - Asymptomatic  Vitals:   04/25/22 0850  BP: (!) 147/83  Pulse: 69  Resp: 16  Temp: 97.9 F (36.6 C)  SpO2: 100%   Filed  Weights   04/25/22 0850  Weight: 221 lb 11.2 oz (100.6 kg)    GENERAL:alert, no distress and comfortable SKIN: skin color, texture, turgor are normal, no rashes or significant lesions EYES: normal, conjunctiva are pink and non-injected, sclera clear OROPHARYNX:no exudate, no erythema and lips, buccal mucosa, and tongue normal  NECK: supple, thyroid normal size, non-tender, without nodularity LYMPH:  no palpable lymphadenopathy in the cervical, axillary3 LUNGS: clear to auscultation and percussion with normal breathing effort HEART: regular rate & rhythm and no murmurs and no lower extremity edema ABDOMEN:abdomen soft, non-tender and normal bowel sounds Musculoskeletal:no cyanosis of digits and no clubbing  PSYCH: alert & oriented x 3 with fluent speech NEURO: no focal motor/sensory deficits  LABORATORY DATA:  I have reviewed the data as listed Lab Results  Component Value Date   WBC 8.5 01/20/2022   HGB 14.3 01/20/2022   HCT 42.0 01/20/2022   MCV 85.3 01/20/2022   PLT 394 01/20/2022     Chemistry      Component Value Date/Time   NA 141 01/20/2022 1006   K 4.4 01/20/2022 1006   CL 109 01/20/2022 1006   CO2 24 12/02/2021 1548   BUN 13 01/20/2022 1006   CREATININE 0.80 01/20/2022 1006      Component Value Date/Time   CALCIUM 8.3 (L) 12/02/2021 1548   ALKPHOS 51 12/02/2021 1548   AST 30 12/02/2021 1548   ALT 17 12/02/2021 1548   BILITOT 0.4 12/02/2021 1548       RADIOGRAPHIC STUDIES: I have personally reviewed the radiological images as listed and agreed with the findings in the report. No results found.  All questions were answered. The patient knows to call the clinic with any problems, questions or concerns. I spent 20 minutes in the care of this patient including H and P, review of records, counseling and coordination of care.     Benay Pike, MD 04/25/2022 8:54 AM

## 2022-05-17 ENCOUNTER — Telehealth: Payer: Self-pay

## 2022-05-17 NOTE — Telephone Encounter (Signed)
Attempted to call pt. No answer. Phone continued to ring with no VM available.

## 2022-05-17 NOTE — Telephone Encounter (Signed)
-----   Message from Rachel Moulds, MD sent at 05/17/2022 12:16 PM EDT ----- Labs show mild iron deficiency but no anemia. She can take oral iron supplement ferrous sulfate 65/325 for 3-6 months and follow up with her PCP.  Thanks,

## 2022-09-04 DIAGNOSIS — R7303 Prediabetes: Secondary | ICD-10-CM | POA: Insufficient documentation

## 2023-04-02 ENCOUNTER — Ambulatory Visit: Payer: 59 | Admitting: Podiatry

## 2023-04-02 ENCOUNTER — Ambulatory Visit (INDEPENDENT_AMBULATORY_CARE_PROVIDER_SITE_OTHER): Payer: 59

## 2023-04-02 DIAGNOSIS — M722 Plantar fascial fibromatosis: Secondary | ICD-10-CM

## 2023-04-02 DIAGNOSIS — M62462 Contracture of muscle, left lower leg: Secondary | ICD-10-CM

## 2023-04-02 MED ORDER — PREDNISONE 5 MG PO TABS: ORAL_TABLET | ORAL | 0 refills | Status: AC

## 2023-04-02 MED ORDER — MELOXICAM 15 MG PO TABS: 15.0000 mg | ORAL_TABLET | Freq: Every day | ORAL | 3 refills | Status: AC

## 2023-04-02 NOTE — Patient Instructions (Signed)

## 2023-04-03 ENCOUNTER — Telehealth: Payer: Self-pay | Admitting: Podiatry

## 2023-04-03 NOTE — Telephone Encounter (Signed)
 Pt called back and stated that she was told to go to [facility] as a walk in and I explained that the note said to wait a day. She said the doctor told her to just walk in over there. She stated the imaging company told her to call to see why we were ordering a xray if we did one yesterday. I explained to her it was not a xray again that it was a mri.She is asking why he is needing a mri if he just did an xray at the appt. I explained that the mri is a more detaled image of the foot verses a xray and if the doctor feels that the xray is not enough he orders the mri. She said none of this was told to her at the appt.  I did tell her that they have to verify insurance to see if authorization is needed.

## 2023-04-03 NOTE — Progress Notes (Signed)
  Subjective:  Patient ID: Sara Erickson, female    DOB: 12/10/1975,  MRN: [id number]  Chief Complaint  Patient presents with   Foot Pain    Left heel pain pt stated that it is a burning pain     48 y.o. female presents with the above complaint. History confirmed with patient.  She said the injection did not help but only for a few weeks after the last visit.  She did not return due to her work schedule.  The right foot does not bother her currently  Objective:  Physical Exam: warm, good capillary refill, no trophic changes or ulcerative lesions, normal DP and PT pulses, normal sensory exam, and left foot pain to palpation plantar medial heel insertion of medial band of plantar fascia on the calcaneus no pain with lateral compression  Radiographs: Multiple views x-ray of left foot taken today show unchanged plantar spur appearance and no evidence of fracture or stress fracture Assessment:   1. Plantar fasciitis, bilateral   2. Gastrocnemius equinus of left lower extremity      Plan:  Patient was evaluated and treated and all questions answered.  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  I treated her in August 2023 for the same with an injection referred her to physical therapy and gave her a home exercise plan as well as steroid and nonsteroidal anti-inflammatories.  She did not improve with this and returns today for further follow-up.  Will prescribe another methylprednisone taper as well as refill of her meloxicam.  I recommended an MRI to evaluate the plantar fascia for any tearing and/or chronic thickening that may require surgical intervention.  Follow-up with me following the MRI.  No follow-ups on file.

## 2023-04-03 NOTE — Telephone Encounter (Signed)
 Pt called and is at [facility] to get an xray per pt.   Upon checking the chart it is for a mri and the note says it may take a day or long to get referral sent over.We have to check on insurance to see if Sara Erickson is needed. Please fax [phone] as soon as possible as pt is there.

## 2023-04-04 ENCOUNTER — Encounter: Payer: Self-pay | Admitting: Podiatry

## 2023-04-09 ENCOUNTER — Telehealth: Payer: Self-pay | Admitting: Podiatry

## 2023-04-09 NOTE — Telephone Encounter (Signed)
 Patient is requesting to be written out of work until MRI results come in due to pain in foot. Patient contact telephone number; [phone]

## 2023-04-10 ENCOUNTER — Encounter: Payer: Self-pay | Admitting: Podiatry

## 2023-04-10 ENCOUNTER — Telehealth: Payer: Self-pay | Admitting: Podiatry

## 2023-04-10 NOTE — Telephone Encounter (Signed)
 Results will take a couple weeks, it depends on what we do with those results if she ends up having surgery or not.  I guess we can always put her out longer depending on what we decide.  Maybe put a date of April 1 right now

## 2023-04-10 NOTE — Telephone Encounter (Signed)
 Pt called and did get the letter that was written yesterday but her job is asking for an estimated date of return to work. Can there be a date even if longer than it takes she could always go back sooner if she gets the results sooner. Please advise

## 2023-04-10 NOTE — Telephone Encounter (Signed)
 Called pt and she did get a note today but she said it made no sense at it said she was out of work until 05/08/23 when she has her mri.  I retyped the note stating that she is to be out of work until 4.1.25 due to her foot condition and I have emailed it to pt per her request [email or web address]. I notified pt it was emailed. She said thank you.

## 2023-04-12 ENCOUNTER — Telehealth: Payer: Self-pay | Admitting: Podiatry

## 2023-04-12 NOTE — Telephone Encounter (Signed)
 Pt will like to know if paper work have been received and/or completed. Please call with an update, TY

## 2023-04-15 ENCOUNTER — Ambulatory Visit
Admission: RE | Admit: 2023-04-15 | Discharge: 2023-04-15 | Disposition: A | Discharge status: Home / Self Care | Payer: 59 | Source: Ambulatory Visit | Attending: Podiatry | Admitting: Podiatry

## 2023-04-15 DIAGNOSIS — M722 Plantar fascial fibromatosis: Secondary | ICD-10-CM

## 2023-04-16 ENCOUNTER — Telehealth: Payer: Self-pay | Admitting: Podiatry

## 2023-04-16 DIAGNOSIS — Z0271 Encounter for disability determination: Secondary | ICD-10-CM

## 2023-04-16 MED ORDER — LORAZEPAM 1 MG PO TABS: ORAL_TABLET | ORAL | 0 refills | Status: AC

## 2023-04-16 NOTE — Telephone Encounter (Signed)
 Patient called and said that she went to her MRI appointment, however because of her anxiety she had to reschedule her appointment. She wanted to know if she could get some medication to help with her anxiety. Thank you.

## 2023-04-16 NOTE — Telephone Encounter (Signed)
 Faxed [employer] [phone] STD forms/dr note. I called to adv of forms being faxed. Pt said addr is [address] [place] [address]. I advised would update on forms and refax and mail her a copy out for her records.

## 2023-04-16 NOTE — Telephone Encounter (Signed)
 Left mess for patient at [phone] on VM to advise forms she was checking to see if we recd have not been recd as of this morning. I advised her to have whomever refax to [phone] my attn and I adv I would call her once I recd them. I advised forms are completed in the order in which they are recd(10-14 business days).

## 2023-04-19 ENCOUNTER — Telehealth: Payer: Self-pay | Admitting: Podiatry

## 2023-04-19 NOTE — Telephone Encounter (Signed)
 Call from [employer] in regards to FMLA forms recd from [place]. Rep hung up before I could get her name. She said need reason for being out of work. I read letter that was attached to forms from Dr Lilian Kapur that she should be out of work 04/10/23-05/14/23 approx. She said ok and hung. No ref# for call

## 2023-04-22 ENCOUNTER — Other Ambulatory Visit

## 2023-04-29 ENCOUNTER — Ambulatory Visit
Admission: RE | Admit: 2023-04-29 | Discharge: 2023-04-29 | Disposition: A | Discharge status: Home / Self Care | Source: Ambulatory Visit | Attending: Podiatry | Admitting: Podiatry

## 2023-04-30 ENCOUNTER — Encounter: Payer: Self-pay | Admitting: Podiatry

## 2023-05-04 DIAGNOSIS — Z0271 Encounter for disability determination: Secondary | ICD-10-CM

## 2023-05-04 NOTE — Telephone Encounter (Signed)
 Faxed [employer] FML[employer] forms to  [phone] with notes indicating RTW date 05/09/23 after 05/08/23 appt to discuss the MRI. See prev notes

## 2023-05-08 ENCOUNTER — Telehealth: Payer: Self-pay | Admitting: Podiatry

## 2023-05-08 ENCOUNTER — Encounter: Payer: Self-pay | Admitting: Podiatry

## 2023-05-08 ENCOUNTER — Ambulatory Visit: Admitting: Podiatry

## 2023-05-08 DIAGNOSIS — M62462 Contracture of muscle, left lower leg: Secondary | ICD-10-CM

## 2023-05-08 DIAGNOSIS — M722 Plantar fascial fibromatosis: Secondary | ICD-10-CM | POA: Diagnosis not present

## 2023-05-08 NOTE — Telephone Encounter (Signed)
 DOS: 05/11/23  (LT) ENDOSCOPIC PLANTAR FASCIOTOMY  (LT) GASTROCNEMIUS RECESS     EFFECTIVE DATE: 12/21/2006   DEDUCTIBLE:  $550.00  REMAINING: $550.00  OOP: $2,500.00  REMAINING:  $2,500.00  CO INSURANCE : 20%    PER STEVE R OF [employer] NO PRIOR AUTH IS REQ FOR CPT CODES [id number],53664  QIH#[id number] STEVER

## 2023-05-08 NOTE — Progress Notes (Signed)
  Subjective:  Patient ID: Sara Erickson, female    DOB: 1975/07/05,  MRN: [id number]  Chief Complaint  Patient presents with   Foot Pain    To discuss MRI results    48 y.o. female presents with the above complaint. History confirmed with patient.  She returns for follow-up she completed the MRI  Objective:  Physical Exam: warm, good capillary refill, no trophic changes or ulcerative lesions, normal DP and PT pulses, normal sensory exam, and left foot pain to palpation plantar medial heel insertion of medial band of plantar fascia on the calcaneus no pain with lateral compression.  Gastrocnemius equinus is present  Radiographs: Multiple views x-ray of left foot taken today show unchanged plantar spur appearance and no evidence of fracture or stress fracture  MRI completed 04/29/2023 full report not available but medial band shows thickening to 6.5 mm on coronal views Assessment:   1. Plantar fasciitis, bilateral   2. Gastrocnemius equinus of left lower extremity      Plan:  Patient was evaluated and treated and all questions answered.  We reviewed the results of the MRI.  We discussed further treatment options including surgical considerations.  We discussed the impact of gastrocnemius equinus on plantar fasciitis.  Surgical we discussed recession of the gastrocnemius muscle as well as plantar fasciotomy both which can be done through endoscopic approach.  Her MRI shows thickening of the medial and central band but no major edema in the small heel spur present so I did not recommend excising this.  We discussed the risk benefits and potential complications including but not limited to  pain, swelling, infection, scar, numbness which may be temporary or permanent, chronic pain, stiffness, nerve pain or damage, wound healing problems.  We also discussed the recovery process including need for therapy following suture removal for about 6 to 8 weeks.  All questions addressed.  Informed  consent signed and reviewed.     Surgical plan:  Procedure: -Left EPF and gastroc recession PRP injection  Location: -[facility]  Anesthesia plan: -Sedation with regional block  Postoperative pain plan: - Tylenol 1000 mg every 6 hours, ibuprofen 600 mg every 6 hours, gabapentin 300 mg every 8 hours x5 days, oxycodone 5 mg 1-2 tabs every 6 hours only as needed  DVT prophylaxis: -None required  WB Restrictions / DME needs: -WBAT in CAM boot postop   Return for after surgery.

## 2023-05-09 NOTE — Telephone Encounter (Signed)
 Faxed [employer] STD forms/notes to [phone]. I included the note from Dr. Lilian Kapur about her being out until 07/07/23.

## 2023-05-11 ENCOUNTER — Other Ambulatory Visit: Payer: Self-pay | Admitting: Podiatry

## 2023-05-11 DIAGNOSIS — M216X2 Other acquired deformities of left foot: Secondary | ICD-10-CM | POA: Diagnosis not present

## 2023-05-11 DIAGNOSIS — M722 Plantar fascial fibromatosis: Secondary | ICD-10-CM | POA: Diagnosis not present

## 2023-05-11 MED ORDER — IBUPROFEN 600 MG PO TABS: 600.0000 mg | ORAL_TABLET | Freq: Four times a day (QID) | ORAL | 0 refills | Status: AC | PRN

## 2023-05-11 MED ORDER — GABAPENTIN 300 MG PO CAPS: 300.0000 mg | ORAL_CAPSULE | Freq: Three times a day (TID) | ORAL | 0 refills | Status: DC

## 2023-05-11 MED ORDER — ACETAMINOPHEN 500 MG PO TABS: 1000.0000 mg | ORAL_TABLET | Freq: Four times a day (QID) | ORAL | 0 refills | Status: AC | PRN

## 2023-05-11 MED ORDER — OXYCODONE HCL 5 MG PO TABS: 5.0000 mg | ORAL_TABLET | ORAL | 0 refills | Status: AC | PRN

## 2023-05-12 ENCOUNTER — Other Ambulatory Visit: Payer: Self-pay | Admitting: Podiatry

## 2023-05-12 NOTE — Progress Notes (Signed)
 Patient called and states that she did not receive pain medication. She is at the pharmacy. I was going to send a new Rx but after speaking with the pharmacist there is a prescription there he is going to fill.

## 2023-05-15 ENCOUNTER — Telehealth: Payer: Self-pay | Admitting: Podiatry

## 2023-05-15 ENCOUNTER — Other Ambulatory Visit: Payer: Self-pay

## 2023-05-15 ENCOUNTER — Ambulatory Visit: Admitting: Podiatry

## 2023-05-15 DIAGNOSIS — M7750 Other enthesopathy of unspecified foot: Secondary | ICD-10-CM

## 2023-05-15 DIAGNOSIS — M62462 Contracture of muscle, left lower leg: Secondary | ICD-10-CM

## 2023-05-15 NOTE — Telephone Encounter (Signed)
 PO surgery 4/2. Need a prescription for a knee scooter. Please call Pt when prescription is ready for pick up. TY

## 2023-05-17 ENCOUNTER — Encounter: Payer: Self-pay | Admitting: Podiatry

## 2023-05-17 ENCOUNTER — Ambulatory Visit (INDEPENDENT_AMBULATORY_CARE_PROVIDER_SITE_OTHER): Admitting: Podiatry

## 2023-05-17 DIAGNOSIS — M722 Plantar fascial fibromatosis: Secondary | ICD-10-CM

## 2023-05-17 DIAGNOSIS — M62462 Contracture of muscle, left lower leg: Secondary | ICD-10-CM | POA: Diagnosis not present

## 2023-05-20 NOTE — Progress Notes (Signed)
  Subjective:  Patient ID: Sara Erickson, female    DOB: 07-20-1975,  MRN: [id number]  No chief complaint on file.   DOS: 05/11/2023 Procedure: Left EPF and endoscopic gastrocnemius recession  48 y.o. female returns for post-op check.  Doing well not having any pain.  The CAM boot is pressing into her thigh and is difficult to walk and because it is too tall  Review of Systems: Negative except as noted in the HPI. Denies N/V/F/Ch.   Objective:  There were no vitals filed for this visit. There is no height or weight on file to calculate BMI. Constitutional Well developed. Well nourished.  Vascular Foot warm and well perfused. Capillary refill normal to all digits.  Calf is soft and supple, no posterior calf or knee pain, negative Homans' sign  Neurologic Normal speech. Oriented to person, place, and time. Epicritic sensation to light touch grossly present bilaterally.  Dermatologic Skin healing well without signs of infection. Skin edges well coapted without signs of infection.  Orthopedic: Tenderness to palpation noted about the surgical site.    Assessment:   1. Gastrocnemius equinus of left lower extremity   2. Plantar fasciitis, bilateral    Plan:  Patient was evaluated and treated and all questions answered.  S/p foot surgery left -Progressing as expected post-operatively.  Doing well.  May shower leg.  Maintain CAM boot.  Short cam boot was dispensed today due to issues with the tall cam boot being inadequate and causing pain.  Return in 2 weeks for suture removal   No follow-ups on file.

## 2023-05-31 ENCOUNTER — Ambulatory Visit (INDEPENDENT_AMBULATORY_CARE_PROVIDER_SITE_OTHER): Admitting: Podiatry

## 2023-05-31 ENCOUNTER — Encounter: Payer: Self-pay | Admitting: Podiatry

## 2023-05-31 VITALS — Ht 69.0 in | Wt 221.7 lb

## 2023-05-31 DIAGNOSIS — M62462 Contracture of muscle, left lower leg: Secondary | ICD-10-CM

## 2023-05-31 DIAGNOSIS — M722 Plantar fascial fibromatosis: Secondary | ICD-10-CM

## 2023-05-31 NOTE — Patient Instructions (Signed)
Call to schedule physical therapy: [facility] [address]  [phone]  

## 2023-06-01 NOTE — Progress Notes (Signed)
  Subjective:  Patient ID: Sara Erickson, female    DOB: 06-21-1975,  MRN: [id number]  Chief Complaint  Patient presents with   Routine Post Op    Pt is here for 2nd post op visit for left foot.    DOS: 05/11/2023 Procedure: Left EPF and endoscopic gastrocnemius recession  48 y.o. female returns for post-op check.    Review of Systems: Negative except as noted in the HPI. Denies N/V/F/Ch.   Objective:  There were no vitals filed for this visit. Body mass index is 32.74 kg/m. Constitutional Well developed. Well nourished.  Vascular Foot warm and well perfused. Capillary refill normal to all digits.  Calf is soft and supple, no posterior calf or knee pain, negative Homans' sign  Neurologic Normal speech. Oriented to person, place, and time. Epicritic sensation to light touch grossly present bilaterally.  Dermatologic Skin healing well without signs of infection. Skin edges well coapted without signs of infection.  Orthopedic: Tenderness to palpation noted about the surgical site.    Assessment:   1. Gastrocnemius equinus of left lower extremity   2. Plantar fasciitis, bilateral    Plan:  Patient was evaluated and treated and all questions answered.  S/p foot surgery left - Doing well sutures removed uneventfully.  PT referral placed to begin PT for strengthening and conditioning.  Should be able to transition out of cam boot after next visit.  Return in about 3 weeks (around 06/21/2023) for post op (no x-rays).

## 2023-06-05 ENCOUNTER — Ambulatory Visit: Attending: Podiatry | Admitting: Physical Therapy

## 2023-06-05 ENCOUNTER — Encounter: Payer: Self-pay | Admitting: Physical Therapy

## 2023-06-05 DIAGNOSIS — M62462 Contracture of muscle, left lower leg: Secondary | ICD-10-CM | POA: Diagnosis not present

## 2023-06-05 DIAGNOSIS — R262 Difficulty in walking, not elsewhere classified: Secondary | ICD-10-CM | POA: Insufficient documentation

## 2023-06-05 DIAGNOSIS — M6281 Muscle weakness (generalized): Secondary | ICD-10-CM | POA: Diagnosis present

## 2023-06-05 DIAGNOSIS — M25572 Pain in left ankle and joints of left foot: Secondary | ICD-10-CM | POA: Diagnosis present

## 2023-06-05 DIAGNOSIS — M722 Plantar fascial fibromatosis: Secondary | ICD-10-CM | POA: Diagnosis not present

## 2023-06-05 NOTE — Therapy (Signed)
 OUTPATIENT PHYSICAL THERAPY LOWER EXTREMITY EVALUATION   Patient Name: Sara Erickson MRN: [id number] DOB:07/29/1975, 48 y.o., female Today's Date: 06/05/2023  END OF SESSION:  PT End of Session - 06/05/23 1203     Visit Number 1    Number of Visits 16    Date for PT Re-Evaluation 07/31/23    Authorization Type [employer]    PT Start Time 1146    PT Stop Time 1230    PT Time Calculation (min) 44 min    Activity Tolerance Patient tolerated treatment well;Patient limited by pain    Behavior During Therapy [facility] for tasks assessed/performed             Past Medical History:  Diagnosis Date   Anemia    Hypertension    Murmur 09/17/2014   Obesity (BMI 30.0-34.9) 09/17/2014   Past Surgical History:  Procedure Laterality Date   HYSTEROSCOPY WITH NOVASURE N/A 01/20/2022   Procedure: DIAGNOSTIC HYSTEROSCOPY WITH POLYPECTOMY;  NOVASURE ENDOMETRIAL ABLATION;  Surgeon: Ivery Marking, MD;  Location: [facility] [place];  Service: Gynecology;  Laterality: N/A;   LUMBAR DISC SURGERY  2017   Patient Active Problem List   Diagnosis Date Noted   IDA (iron deficiency anemia) 01/11/2022   Essential hypertension 09/17/2014   Murmur 09/17/2014   Obesity (BMI 30.0-34.9) 09/17/2014    PCP: Calton Catholic, PA-C   REFERRING PROVIDER: Floyce Hutching, DPM   REFERRING DIAG:  [phone] (ICD-10-CM) - Gastrocnemius equinus of left lower extremity  M72.2 (ICD-10-CM) - Plantar fasciitis, bilateral    THERAPY DIAG:  Pain in left ankle and joints of left foot  Muscle weakness (generalized)  Difficulty in walking, not elsewhere classified  Rationale for Evaluation and Treatment: Rehabilitation  ONSET DATE: 05-11-23 Surgery  SUBJECTIVE:   SUBJECTIVE STATEMENT: I had the Left EPF and endoscopic gastrocnemius recession  with PRP Surgery. 05-11-23, this Friday will be 4 weeks. I can't stand or walk for more than 1 minute.  I hurt so much. I have a boot to wear. I work and stand for  long hours at [employer] and I cannot stand standing for even one minute. I can't shop without using a motorized cart.  PERTINENT HISTORY: Heart Murmur, HTN, obesity,  lumbar back surgery 2017 PAIN:  Are you having pain? Yes: NPRS scale: 7/10 at rest  and at worst 10/10 Pain location: Left on back/achilles tendon. Pain description: sharp Aggravating factors: can;t stand on foot for more than 1 minute, can't walk right now at all, Sleeping is good with medicine, can't do household Relieving factors: Medication but don't like taking it Pt unable to stand in the shower PRECAUTIONS: None  RED FLAGS: None   WEIGHT BEARING RESTRICTIONS: Yes WBAT Left  FALLS:  Has patient fallen in last 6 months? No  LIVING ENVIRONMENT: Lives with: lives with their spouse and lives with their daughter Lives in: House/apartment Stairs:  difficulty with stairs  now on 1st floor Has following equipment at home:  Cam walking boot  OCCUPATION: work at [employer] standing most of time  PLOF: Independent  PATIENT GOALS: Get back to work at [employer], walking for exercise  NEXT MD VISIT: TBD  OBJECTIVE:  Note: Objective measures were completed at Evaluation unless otherwise noted.  DIAGNOSTIC FINDINGS: See medical record  PATIENT SURVEYS:  LEFS 20/80  25%  COGNITION: Overall cognitive status: Within functional limits for tasks assessed     SENSATION: WFL  EDEMA:  NT  MUSCLE LENGTH: Hamstrings: Bilateral tightness  POSTURE: rounded shoulders, forward head, flexed trunk , and obese  PALPATION: TTP over achilles tendon and plantar fascia of L LE  LOWER EXTREMITY ROM:  Active ROM Right eval Left eval  Hip flexion    Hip extension    Hip abduction    Hip adduction    Hip internal rotation    Hip external rotation    Knee flexion 120 120  Knee extension 0 0  Ankle dorsiflexion 11 0  Ankle plantarflexion 55 52  Ankle inversion 25 21  Ankle eversion 25 11   (Blank rows = not  tested)  LOWER EXTREMITY MMT:  MMT Right eval Left eval  Hip flexion    Hip extension    Hip abduction    Hip adduction    Hip internal rotation    Hip external rotation    Knee flexion 115 115  Knee extension    Ankle dorsiflexion    Ankle plantarflexion 4/25 0/25  Ankle inversion    Ankle eversion     (Blank rows = not tested)  LOWER EXTREMITY SPECIAL TESTS:  NT due to surgery  FUNCTIONAL TESTS:  2 minute walk test: TBD 30 sec STS  6 x but heavy use of hands    SL stance  0 sec on Left,  SL stance 15 sec   GAIT: Distance walked: 150 with CAM walking boot in hand /wearing regular shoe Assistive device utilized: None Level of assistance: Modified independence Comments: using CAM short boot and antalgic gait left                                                                                                                                TREATMENT DATE: 06-05-23    PATIENT EDUCATION:  Education details: POC Explanation of findings  issue of HEP Person educated: Patient Education method: Explanation, Demonstration, Tactile cues, Verbal cues, and Handouts Education comprehension: verbalized understanding, returned demonstration, verbal cues required, tactile cues required, and needs further education  HOME EXERCISE PROGRAM: Access Code: [email or web address] URL: [email or web address] Date: 06/05/2023 Prepared by: Sharlet Dawson  Program Notes TOe yoga,  great toe extension followed by 2-5 digit extension  Exercises - Seated Heel Raise  - 1 x daily - 7 x weekly - 1-2 sets - 10 reps - Seated Arch Lifts  - 1 x daily - 7 x weekly - 3 sets - 10 reps - Seated Ankle Inversion with Anchored Resistance  - 1 x daily - 7 x weekly - 3 sets - 10 reps - Seated Ankle Eversion with Anchored Resistance  - 1 x daily - 7 x weekly - 3 sets - 10 reps - Seated Ankle Plantar Flexion with Resistance Loop  - 1 x daily - 7 x weekly - 3 sets - 10 reps - Seated Ankle Dorsiflexion  with Resistance  - 1 x daily - 7 x weekly - 3 sets - 10 reps - Seated Heel Toe Raises  -  1 x daily - 7 x weekly - 3 sets - 10 reps  ASSESSMENT:  CLINICAL IMPRESSION: Patient is a 48 y.o. female who was seen today for physical therapy evaluation and treatment for Left EPF and endoscopic gastrocnemius recession  with PRP using short CAM boot. Pt with difficulty walking and 7/10 to 10/10 pain. Pt with impairment with AROM, weakness, difficulty walking and pain and inability to perform basic household chores due to pain.  Pt will benefit from skilled PT in order to return to work at [employer].   OBJECTIVE IMPAIRMENTS: decreased activity tolerance, decreased knowledge of condition, decreased mobility, difficulty walking, decreased ROM, decreased strength, increased fascial restrictions, postural dysfunction, obesity, and pain.   ACTIVITY LIMITATIONS: carrying, standing, squatting, stairs, transfers, and locomotion level  PARTICIPATION LIMITATIONS: meal prep, cleaning, laundry, shopping, community activity, and occupation  PERSONAL FACTORS: Heart Murmur, HTN, obesity,  lumbar back surgery 2017 are also affecting patient's functional outcome.   REHAB POTENTIAL: Good  CLINICAL DECISION MAKING: Evolving/moderate complexity  EVALUATION COMPLEXITY: Moderate   GOALS: Goals reviewed with patient? Yes  SHORT TERM GOALS: Target date: 07-03-23 Pt will be I with initial HEP Baseline:no knowledge Goal status: INITIAL  2.  Pt pain in L LE to decrease by 25% Baseline: at worst 10/10  Goal status: INITIAL  3.  Pt will be able to ambulate without CAM boot  Baseline: using short CAM boot at evaluation Goal status: INITIAL    LONG TERM GOALS: Target date: 07-31-23  Pt will be independent with advanced HEP Baseline: no knowledge Goal status: INITIAL  2.  Pt pain level negotiating steps will be 3/10 or less Baseline: unable to negotiate steps.  7-10/10 Goal status: INITIAL  3.  Pt will be able to  perform household chores for at least 15 min without break Baseline: unable to perform chores cue to inability to stand greater than 1 minute Goal status: INITIAL  4.  Pt will be able to walk for 20 minutes to begin walking exercise program Baseline: unable to walk without CAM walking boot Goal status: INITIAL  5.  Pt will be able to shop without dependence on shopping scooter Baseline:  Goal status: INITIAL  6.  Pt will improve LEFS to at least 40/80 50% Baseline: 20/80 25 % eval Goal status: INITIAL   PLAN:  PT FREQUENCY: 1-2x/week  PT DURATION: 8 weeks  PLANNED INTERVENTIONS: 97164- PT Re-evaluation, 97750- Physical Performance Testing, 97110-Therapeutic exercises, 97530- Therapeutic activity, [id number]- Neuromuscular re-education, 97535- Self Care, [id number]- Manual therapy, [id number]- Gait training, [phone]- Electrical stimulation (manual), Patient/Family education, Stair training, Taping, Dry Needling, Joint mobilization, Cryotherapy, and Moist heat  PLAN FOR NEXT SESSION: progress exercise and AROM of L LE , Do 2 minute or 6 minute Walk test as tolerated   Sharlet Dawson, PT, ATRIC Certified Exercise Expert for the Aging Adult  06/05/23 3:24 PM Phone: [phone] Fax: [phone]

## 2023-06-11 ENCOUNTER — Telehealth: Payer: Self-pay | Admitting: Podiatry

## 2023-06-11 NOTE — Telephone Encounter (Signed)
 Patient walked into the office today after trying to reach out to us  on Friday. She is having a lot of pain most of the time but especially when she has the boot on. She is having pain on the back of her calf and she says it runs down to the achillis. She would like to speak with you on the issue if you could call her back and let her know what she should do.

## 2023-06-11 NOTE — Telephone Encounter (Signed)
 Spoke with patient by phone, she does not have any pain or swelling in the top of the calf or behind the knee to indicate DVT most of the pain is centered in between the 2 scars from the gastroc recession, discussed this likely is normal at this point she is going to take it easy with therapy and discussed that we will take some time for that strength to return fully.  She may remove the boot when awake or when sleeping if this is more comfortable.  But she should not ambulate without the boot on.

## 2023-06-13 ENCOUNTER — Encounter: Payer: Self-pay | Admitting: Physical Therapy

## 2023-06-13 ENCOUNTER — Ambulatory Visit: Attending: Podiatry | Admitting: Physical Therapy

## 2023-06-13 DIAGNOSIS — M25572 Pain in left ankle and joints of left foot: Secondary | ICD-10-CM | POA: Insufficient documentation

## 2023-06-13 DIAGNOSIS — M6281 Muscle weakness (generalized): Secondary | ICD-10-CM | POA: Insufficient documentation

## 2023-06-13 DIAGNOSIS — R262 Difficulty in walking, not elsewhere classified: Secondary | ICD-10-CM | POA: Insufficient documentation

## 2023-06-13 NOTE — Therapy (Signed)
 OUTPATIENT PHYSICAL THERAPY LOWER EXTREMITY EVALUATION   Patient Name: Sara Erickson MRN: [id number] DOB:12-04-1975, 48 y.o., female Today's Date: 06/13/2023  END OF SESSION:  PT End of Session - 06/13/23 1208     Visit Number 2    Number of Visits 16    Date for PT Re-Evaluation 07/31/23    Authorization Type Aetna    PT Start Time 1210    PT Stop Time 1248    PT Time Calculation (min) 38 min    Activity Tolerance Patient tolerated treatment well;Patient limited by pain    Behavior During Therapy [facility] for tasks assessed/performed             Past Medical History:  Diagnosis Date   Anemia    Hypertension    Murmur 09/17/2014   Obesity (BMI 30.0-34.9) 09/17/2014   Past Surgical History:  Procedure Laterality Date   HYSTEROSCOPY WITH NOVASURE N/A 01/20/2022   Procedure: DIAGNOSTIC HYSTEROSCOPY WITH POLYPECTOMY;  NOVASURE ENDOMETRIAL ABLATION;  Surgeon: Ivery Marking, MD;  Location: [facility];  Service: Gynecology;  Laterality: N/A;   LUMBAR DISC SURGERY  2017   Patient Active Problem List   Diagnosis Date Noted   IDA (iron deficiency anemia) 01/11/2022   Essential hypertension 09/17/2014   Murmur 09/17/2014   Obesity (BMI 30.0-34.9) 09/17/2014    PCP: Calton Catholic, PA-C   REFERRING PROVIDER: Floyce Hutching, DPM   REFERRING DIAG:  [phone] (ICD-10-CM) - Gastrocnemius equinus of left lower extremity  M72.2 (ICD-10-CM) - Plantar fasciitis, bilateral    THERAPY DIAG:  Muscle weakness (generalized)  Pain in left ankle and joints of left foot  Difficulty in walking, not elsewhere classified  Rationale for Evaluation and Treatment: Rehabilitation  ONSET DATE: 05-11-23 Surgery  SUBJECTIVE:   SUBJECTIVE STATEMENT: Pt attended today's session with reports of 8/10 pain. Pt stated that they have maintained fair compliance with current HEP.  Says that she can't do a lot of the exercises because it is too much and hurts a lot.   I  had the Left EPF and endoscopic gastrocnemius recession  with PRP Surgery. 05-11-23, this Friday will be 4 weeks. I can't stand or walk for more than 1 minute.  I hurt so much. I have a boot to wear. I work and stand for long hours at [employer] and I cannot stand standing for even one minute. I can't shop without using a motorized cart.  PERTINENT HISTORY: Heart Murmur, HTN, obesity,  lumbar back surgery 2017 PAIN:  Are you having pain? Yes: NPRS scale: 7/10 at rest  and at worst 10/10 Pain location: Left on back/achilles tendon. Pain description: sharp Aggravating factors: can;t stand on foot for more than 1 minute, can't walk right now at all, Sleeping is good with medicine, can't do household Relieving factors: Medication but don't like taking it Pt unable to stand in the shower PRECAUTIONS: None  RED FLAGS: None   WEIGHT BEARING RESTRICTIONS: Yes WBAT Left  FALLS:  Has patient fallen in last 6 months? No  LIVING ENVIRONMENT: Lives with: lives with their spouse and lives with their daughter Lives in: House/apartment Stairs:  difficulty with stairs  now on 1st floor Has following equipment at home:  Cam walking boot  OCCUPATION: work at [employer] standing most of time  PLOF: Independent  PATIENT GOALS: Get back to work at [employer], walking for exercise  NEXT MD VISIT: TBD  OBJECTIVE:  Note: Objective measures were completed at Evaluation unless otherwise noted.  DIAGNOSTIC FINDINGS: See medical record  PATIENT SURVEYS:  LEFS 20/80  25%  COGNITION: Overall cognitive status: Within functional limits for tasks assessed     SENSATION: WFL  EDEMA:  NT  MUSCLE LENGTH: Hamstrings: Bilateral tightness   POSTURE: rounded shoulders, forward head, flexed trunk , and obese  PALPATION: TTP over achilles tendon and plantar fascia of L LE  LOWER EXTREMITY ROM:  Active ROM Right eval Left eval  Hip flexion    Hip extension    Hip abduction    Hip adduction    Hip  internal rotation    Hip external rotation    Knee flexion 120 120  Knee extension 0 0  Ankle dorsiflexion 11 0  Ankle plantarflexion 55 52  Ankle inversion 25 21  Ankle eversion 25 11   (Blank rows = not tested)  LOWER EXTREMITY MMT:  MMT Right eval Left eval  Hip flexion    Hip extension    Hip abduction    Hip adduction    Hip internal rotation    Hip external rotation    Knee flexion 115 115  Knee extension    Ankle dorsiflexion    Ankle plantarflexion 4/25 0/25  Ankle inversion    Ankle eversion     (Blank rows = not tested)  LOWER EXTREMITY SPECIAL TESTS:  NT due to surgery  FUNCTIONAL TESTS:  2 minute walk test: TBD 30 sec STS  6 x but heavy use of hands    SL stance  0 sec on Left,  SL stance 15 sec   GAIT: Distance walked: 150 with CAM walking boot in hand /wearing regular shoe Assistive device utilized: None Level of assistance: Modified independence Comments: using CAM short boot and antalgic gait left                                                                                                                                TREATMENT DATE:  [facility] Adult PT Treatment:                                                DATE: 06/13/2023  Therapeutic Activity: Ankle AROM CW/CCW 2x30s ea Seated heel raise 2x10, hold 3s Seated ankle inv/ev on towel 2x12 Great toe isolation 2x15 Towel scrunch 2x10 (d/c d/t pain) Standing weight shifts in CAM boot 1x8, 10s    PATIENT EDUCATION:  Education details: POC Explanation of findings  issue of HEP Person educated: Patient Education method: Explanation, Demonstration, Tactile cues, Verbal cues, and Handouts Education comprehension: verbalized understanding, returned demonstration, verbal cues required, tactile cues required, and needs further education  HOME EXERCISE PROGRAM: Access Code: [email or web address] URL: [email or web address] Date: 06/05/2023 Prepared by: Sharlet Dawson  Program  Notes TOe yoga,  great toe extension followed by 2-5 digit extension  Exercises -  Seated Heel Raise  - 1 x daily - 7 x weekly - 1-2 sets - 10 reps - Seated Arch Lifts  - 1 x daily - 7 x weekly - 3 sets - 10 reps - Seated Ankle Inversion with Anchored Resistance  - 1 x daily - 7 x weekly - 3 sets - 10 reps - Seated Ankle Eversion with Anchored Resistance  - 1 x daily - 7 x weekly - 3 sets - 10 reps - Seated Ankle Plantar Flexion with Resistance Loop  - 1 x daily - 7 x weekly - 3 sets - 10 reps - Seated Ankle Dorsiflexion with Resistance  - 1 x daily - 7 x weekly - 3 sets - 10 reps - Seated Heel Toe Raises  - 1 x daily - 7 x weekly - 3 sets - 10 reps  ASSESSMENT:  CLINICAL IMPRESSION: Pt attended physical therapy session for continuation of treatment regarding Left EPF and endoscopic gastrocnemius recession . Today's treatment focused on improvement of  R ankle/foot motility and strength, WB tolerance, and intrinsic foot strength. Pt showed fair tolerance to administered treatment with no adverse effects by the end of session. Pt was highly avoidant of painful movement and pt seems to have a limited understanding of expectations regarding recovery times of surgery. Pt required moderate verbal/tactile cuing alongside no physical assistance for safe and appropriate performance of today's activities. F/u with pt regarding tolerance to walking in CAM boot around store w/cart instead of power scooter as discussed in today's session  Eval Impression:Patient is a 48 y.o. female who was seen today for physical therapy evaluation and treatment for Left EPF and endoscopic gastrocnemius recession  with PRP using short CAM boot. Pt with difficulty walking and 7/10 to 10/10 pain. Pt with impairment with AROM, weakness, difficulty walking and pain and inability to perform basic household chores due to pain.  Pt will benefit from skilled PT in order to return to work at [employer].   OBJECTIVE IMPAIRMENTS: decreased  activity tolerance, decreased knowledge of condition, decreased mobility, difficulty walking, decreased ROM, decreased strength, increased fascial restrictions, postural dysfunction, obesity, and pain.   ACTIVITY LIMITATIONS: carrying, standing, squatting, stairs, transfers, and locomotion level  PARTICIPATION LIMITATIONS: meal prep, cleaning, laundry, shopping, community activity, and occupation  PERSONAL FACTORS: Heart Murmur, HTN, obesity,  lumbar back surgery 2017 are also affecting patient's functional outcome.   REHAB POTENTIAL: Good  CLINICAL DECISION MAKING: Evolving/moderate complexity  EVALUATION COMPLEXITY: Moderate   GOALS: Goals reviewed with patient? Yes  SHORT TERM GOALS: Target date: 07-03-23 Pt will be I with initial HEP Baseline:no knowledge Goal status: INITIAL  2.  Pt pain in L LE to decrease by 25% Baseline: at worst 10/10  Goal status: INITIAL  3.  Pt will be able to ambulate without CAM boot  Baseline: using short CAM boot at evaluation Goal status: INITIAL    LONG TERM GOALS: Target date: 07-31-23  Pt will be independent with advanced HEP Baseline: no knowledge Goal status: INITIAL  2.  Pt pain level negotiating steps will be 3/10 or less Baseline: unable to negotiate steps.  7-10/10 Goal status: INITIAL  3.  Pt will be able to perform household chores for at least 15 min without break Baseline: unable to perform chores cue to inability to stand greater than 1 minute Goal status: INITIAL  4.  Pt will be able to walk for 20 minutes to begin walking exercise program Baseline: unable to walk without CAM walking boot Goal  status: INITIAL  5.  Pt will be able to shop without dependence on shopping scooter Baseline:  Goal status: INITIAL  6.  Pt will improve LEFS to at least 40/80 50% Baseline: 20/80 25 % eval Goal status: INITIAL   PLAN:  PT FREQUENCY: 1-2x/week  PT DURATION: 8 weeks  PLANNED INTERVENTIONS: 97164- PT Re-evaluation,  97750- Physical Performance Testing, 97110-Therapeutic exercises, 97530- Therapeutic activity, [id number]- Neuromuscular re-education, 97535- Self Care, [id number]- Manual therapy, [id number]- Gait training, [phone]- Electrical stimulation (manual), Patient/Family education, Stair training, Taping, Dry Needling, Joint mobilization, Cryotherapy, and Moist heat  PLAN FOR NEXT SESSION: progress exercise and AROM of L LE , Do 2 minute or 6 minute Walk test as tolerated   Albesa Huguenin, PT, DPT 06/13/2023, 1:44 PM

## 2023-06-14 NOTE — Therapy (Signed)
 OUTPATIENT PHYSICAL THERAPY LOWER EXTREMITY EVALUATION   Patient Name: Sara Erickson MRN: [id number] DOB:Apr 07, 1975, 48 y.o., female Today's Date: 06/19/2023  END OF SESSION:  PT End of Session - 06/19/23 0756     Visit Number 4    Number of Visits 16    Date for PT Re-Evaluation 07/31/23    Authorization Type Aet[employer]    PT Start Time 0800    PT Stop Time 0845    PT Time Calculation (min) 45 min    Activity Tolerance Patient tolerated treatment well;Patient limited by pain    Behavior During Therapy [facility] for tasks assessed/performed              Past Medical History:  Diagnosis Date   Anemia    Hypertension    Murmur 09/17/2014   Obesity (BMI 30.0-34.9) 09/17/2014   Past Surgical History:  Procedure Laterality Date   HYSTEROSCOPY WITH NOVASURE N/A 01/20/2022   Procedure: DIAGNOSTIC HYSTEROSCOPY WITH POLYPECTOMY;  NOVASURE ENDOMETRIAL ABLATION;  Surgeon: Ivery Marking, MD;  Location: [facility] [place];  Service: Gynecology;  Laterality: N/A;   LUMBAR DISC SURGERY  2017   Patient Active Problem List   Diagnosis Date Noted   IDA (iron deficiency anemia) 01/11/2022   Essential hypertension 09/17/2014   Murmur 09/17/2014   Obesity (BMI 30.0-34.9) 09/17/2014    PCP: Calton Catholic, PA-C   REFERRING PROVIDER: Floyce Hutching, DPM   REFERRING DIAG:  [phone] (ICD-10-CM) - Gastrocnemius equinus of left lower extremity  M72.2 (ICD-10-CM) - Plantar fasciitis, bilateral    THERAPY DIAG:  Muscle weakness (generalized)  Pain in left ankle and joints of left foot  Difficulty in walking, not elsewhere classified  Rationale for Evaluation and Treatment: Rehabilitation  ONSET DATE: 05-11-23 Surgery  SUBJECTIVE:   SUBJECTIVE STATEMENT: Pt attended today's session with reports of 7/10 pain.  Last night I needed to take a pain pill and I feel like I am getting knots in the bottom of my foot. Pt stated that they have maintained fair compliance with  current HEP.  Says that she can't do a lot of the exercises because it is too much and hurts a lot. She says she can't wait to get out of the boot. She is concerned about standing more than 15 minutes now to wash dishes with the boot now.   I had the Left EPF and endoscopic gastrocnemius recession  with PRP Surgery. 05-11-23, this Friday will be 4 weeks. I can't stand or walk for more than 1 minute.  I hurt so much. I have a boot to wear. I work and stand for long hours at [employer] and I cannot stand standing for even one minute. I can't shop without using a motorized cart.  PERTINENT HISTORY: Heart Murmur, HTN, obesity,  lumbar back surgery 2017 PAIN:  Are you having pain? Yes: NPRS scale: 7/10 at rest  and at worst 10/10 Pain location: Left on back/achilles tendon. Pain description: sharp Aggravating factors: can;t stand on foot for more than 1 minute, can't walk right now at all, Sleeping is good with medicine, can't do household Relieving factors: Medication but don't like taking it Pt unable to stand in the shower PRECAUTIONS: None  RED FLAGS: None   WEIGHT BEARING RESTRICTIONS: Yes WBAT Left  FALLS:  Has patient fallen in last 6 months? No  LIVING ENVIRONMENT: Lives with: lives with their spouse and lives with their daughter Lives in: House/apartment Stairs: difficulty with stairs  now on 1st floor  Has following equipment at home: Cam walking boot  OCCUPATION: work at [employer] standing most of time  PLOF: Independent  PATIENT GOALS: Get back to work at [employer], walking for exercise  NEXT MD VISIT: TBD  OBJECTIVE:  Note: Objective measures were completed at Evaluation unless otherwise noted.  DIAGNOSTIC FINDINGS: See medical record  PATIENT SURVEYS:  LEFS 20/80  25%  COGNITION: Overall cognitive status: Within functional limits for tasks assessed     SENSATION: WFL  EDEMA:  NT  MUSCLE LENGTH: Hamstrings: Bilateral tightness   POSTURE: rounded shoulders,  forward head, flexed trunk , and obese  PALPATION: TTP over achilles tendon and plantar fascia of L LE  LOWER EXTREMITY ROM:  Active ROM Right eval Left eval Left  Hip flexion     Hip extension     Hip abduction     Hip adduction     Hip internal rotation     Hip external rotation     Knee flexion 120 120   Knee extension 0 0   Ankle dorsiflexion 11 0 10  Ankle plantarflexion 55 52   Ankle inversion 25 21   Ankle eversion 25 11    (Blank rows = not tested)  LOWER EXTREMITY MMT:  MMT Right eval Left eval  Hip flexion    Hip extension    Hip abduction    Hip adduction    Hip internal rotation    Hip external rotation    Knee flexion 115 115  Knee extension    Ankle dorsiflexion    Ankle plantarflexion 4/25 0/25  Ankle inversion    Ankle eversion     (Blank rows = not tested)  LOWER EXTREMITY SPECIAL TESTS:  NT due to surgery  FUNCTIONAL TESTS:  2 minute walk test: TBD 30 sec STS  6 x but heavy use of hands    SL stance  0 sec on Left,  SL stance 15 sec   GAIT: Distance walked: 150 with CAM walking boot in hand /wearing regular shoe Assistive device utilized: None Level of assistance: Modified independence Comments: using CAM short boot and antalgic gait left                                                                                                                                TREATMENT DATE:  [facility] Adult PT Treatment:                                                DATE: 06-19-23 Therapeutic Exercise: Ankle AROM CW/CCW 2x30s ea Pt reports doing all the time Seated heel raise 2x10, hold 3s Seated ankle inv/ev on towel 2x12 Great toe isolation 2x15 Towel scrunch attempted 3 x but DC Standing weight shifts in CAM boot forward and backward and side to side GTB Left  inversion 3x 10 GTB Left eversion 3 x 10 Added sidelying left hip abduction 2 x 10 Added Squats 2 x 10 Manual - PT added gentle DF stretch and gentle mobs of Metatarsal and tarsals    [facility] Adult PT Treatment:                                                DATE: 06/13/2023  Therapeutic Activity: Ankle AROM CW/CCW 2x30s ea Seated heel raise 2x10, hold 3s Seated ankle inv/ev on towel 2x12 Great toe isolation 2x15 Towel scrunch 2x10 (d/c d/t pain) Standing weight shifts in CAM boot 1x8, 10s      PATIENT EDUCATION:  Education details: POC Explanation of findings  issue of HEP Person educated: Patient Education method: Explanation, Demonstration, Tactile cues, Verbal cues, and Handouts Education comprehension: verbalized understanding, returned demonstration, verbal cues required, tactile cues required, and needs further education  HOME EXERCISE PROGRAM: Access Code: [email or web address] URL: [email or web address] Date: 06/05/2023 Prepared by: Sharlet Dawson  Program Notes TOe yoga,  great toe extension followed by 2-5 digit extension  Exercises - Seated Heel Raise  - 1 x daily - 7 x weekly - 1-2 sets - 10 reps - Seated Arch Lifts  - 1 x daily - 7 x weekly - 3 sets - 10 reps - Seated Ankle Inversion with Anchored Resistance  - 1 x daily - 7 x weekly - 3 sets - 10 reps - Seated Ankle Eversion with Anchored Resistance  - 1 x daily - 7 x weekly - 3 sets - 10 reps - Seated Ankle Plantar Flexion with Resistance Loop  - 1 x daily - 7 x weekly - 3 sets - 10 reps - Seated Ankle Dorsiflexion with Resistance  - 1 x daily - 7 x weekly - 3 sets - 10 reps - Seated Heel Toe Raises  - 1 x daily - 7 x weekly - 3 sets - 10 reps Added 06-19-23  - Squat with Chair and Counter Support  - 1 x daily - 7 x weekly - 3 sets - 10 reps - sidelying Hip Abduction with ball under neck for support  - 1 x daily - 7 x weekly - 3 sets - 10 reps ASSESSMENT:  CLINICAL IMPRESSION: Pt attended physical therapy session for continuation of treatment regarding Left EPF and endoscopic gastrocnemius recession . Today's treatment focused on improvement of  Left ankle/foot mobility and  strength, WB tolerance, and intrinsic foot strength. Pt AROM of Left DF 10 degrees improved from eval 0. Pt continues to show fair tolerance to current exercise and wt bearing tolerance with CAM boot with no adverse effects by the end of session. Pt achieved STG # 1 and # 2 and ongoing work with [employer] bearing with CAM boot tolerance.  Pt reports only able to tolerate 10 minutes standing while washing dishes. Pt required moderate verbal/tactile cuing alongside no physical assistance for safe and appropriate performance of today's activities. Pt to see MD on Thursday for check up.  Eval Impression:Patient is a 48 y.o. female who was seen today for physical therapy evaluation and treatment for Left EPF and endoscopic gastrocnemius recession  with PRP using short CAM boot. Pt with difficulty walking and 7/10 to 10/10 pain. Pt with impairment with AROM, weakness, difficulty walking and pain and inability to perform basic household chores due to pain.  Pt  will benefit from skilled PT in order to return to work at [employer].   OBJECTIVE IMPAIRMENTS: decreased activity tolerance, decreased knowledge of condition, decreased mobility, difficulty walking, decreased ROM, decreased strength, increased fascial restrictions, postural dysfunction, obesity, and pain.   ACTIVITY LIMITATIONS: carrying, standing, squatting, stairs, transfers, and locomotion level  PARTICIPATION LIMITATIONS: meal prep, cleaning, laundry, shopping, community activity, and occupation  PERSONAL FACTORS: Heart Murmur, HTN, obesity,  lumbar back surgery 2017 are also affecting patient's functional outcome.   REHAB POTENTIAL: Good  CLINICAL DECISION MAKING: Evolving/moderate complexity  EVALUATION COMPLEXITY: Moderate   GOALS: Goals reviewed with patient? Yes  SHORT TERM GOALS: Target date: 07-03-23 Pt will be I with initial HEP Baseline:no knowledge 06-19-23  Compliant with exercises at home Goal status: MET  2.  Pt pain in L LE to  decrease by 25% Baseline: at worst 10/10  06-19-23 7/10 mostly at home relaxed 4-5/10 Goal status: MET  3.  Pt will be able to ambulate without CAM boot  Baseline: using short CAM boot at evaluation 06-19-23 Still weaning from CAM boot, unable to step without boot needing it  for balance Goal status:ONGOING.    LONG TERM GOALS: Target date: 07-31-23  Pt will be independent with advanced HEP Baseline: no knowledge Goal status: INITIAL  2.  Pt pain level negotiating steps will be 3/10 or less Baseline: unable to negotiate steps.  7-10/10 Goal status: INITIAL  3.  Pt will be able to perform household chores for at least 15 min without break Baseline: unable to perform chores cue to inability to stand greater than 1 minute Goal status: INITIAL  4.  Pt will be able to walk for 20 minutes to begin walking exercise program Baseline: unable to walk without CAM walking boot Goal status: INITIAL  5.  Pt will be able to shop without dependence on shopping scooter Baseline:  Goal status: INITIAL  6.  Pt will improve LEFS to at least 40/80 50% Baseline: 20/80 25 % eval Goal status: INITIAL   PLAN:  PT FREQUENCY: 1-2x/week  PT DURATION: 8 weeks  PLANNED INTERVENTIONS: 97164- PT Re-evaluation, 97750- Physical Performance Testing, 97110-Therapeutic exercises, 97530- Therapeutic activity, [id number]- Neuromuscular re-education, 97535- Self Care, 16109- Manual therapy, [id number]- Gait training, [phone]- Electrical stimulation (manual), Patient/Family education, Stair training, Taping, Dry Needling, Joint mobilization, Cryotherapy, and Moist heat  PLAN FOR NEXT SESSION: progress exercise and AROM of L LE , Do 2 minute or 6 minute Walk test as tolerated   Sharlet Dawson, PT, ATRIC Certified Exercise Expert for the Aging Adult  06/19/23 8:50 AM Phone: [phone] Fax: [phone]

## 2023-06-15 ENCOUNTER — Encounter: Payer: Self-pay | Admitting: Physical Therapy

## 2023-06-15 ENCOUNTER — Ambulatory Visit: Admitting: Physical Therapy

## 2023-06-15 DIAGNOSIS — R262 Difficulty in walking, not elsewhere classified: Secondary | ICD-10-CM

## 2023-06-15 DIAGNOSIS — M25572 Pain in left ankle and joints of left foot: Secondary | ICD-10-CM

## 2023-06-15 DIAGNOSIS — M6281 Muscle weakness (generalized): Secondary | ICD-10-CM | POA: Diagnosis not present

## 2023-06-15 NOTE — Therapy (Signed)
 OUTPATIENT PHYSICAL THERAPY LOWER EXTREMITY EVALUATION   Patient Name: Sara Erickson MRN: [id number] DOB:03/23/1975, 48 y.o., female Today's Date: 06/15/2023  END OF SESSION:  PT End of Session - 06/15/23 1154     Visit Number 3    Number of Visits 16    Date for PT Re-Evaluation 07/31/23    Authorization Type Aetna    PT Start Time 1150    PT Stop Time 1215    PT Time Calculation (min) 25 min             Past Medical History:  Diagnosis Date   Anemia    Hypertension    Murmur 09/17/2014   Obesity (BMI 30.0-34.9) 09/17/2014   Past Surgical History:  Procedure Laterality Date   HYSTEROSCOPY WITH NOVASURE N/A 01/20/2022   Procedure: DIAGNOSTIC HYSTEROSCOPY WITH POLYPECTOMY;  NOVASURE ENDOMETRIAL ABLATION;  Surgeon: Ivery Marking, MD;  Location: [facility] [place];  Service: Gynecology;  Laterality: N/A;   LUMBAR DISC SURGERY  2017   Patient Active Problem List   Diagnosis Date Noted   IDA (iron deficiency anemia) 01/11/2022   Essential hypertension 09/17/2014   Murmur 09/17/2014   Obesity (BMI 30.0-34.9) 09/17/2014    PCP: Calton Catholic, PA-C   REFERRING PROVIDER: Floyce Hutching, DPM   REFERRING DIAG:  [id number] (ICD-10-CM) - Gastrocnemius equinus of left lower extremity  M72.2 (ICD-10-CM) - Plantar fasciitis, bilateral    THERAPY DIAG:  Muscle weakness (generalized)  Pain in left ankle and joints of left foot  Difficulty in walking, not elsewhere classified  Rationale for Evaluation and Treatment: Rehabilitation  ONSET DATE: 05-11-23 Surgery  SUBJECTIVE:   SUBJECTIVE STATEMENT: Pt arrives in boot with 5-6/10 pain at lower calf area.     I had the Left EPF and endoscopic gastrocnemius recession  with PRP Surgery. 05-11-23, this Friday will be 4 weeks. I can't stand or walk for more than 1 minute.  I hurt so much. I have a boot to wear. I work and stand for long hours at [employer] and I cannot stand standing for even one minute. I can't  shop without using a motorized cart.  PERTINENT HISTORY: Heart Murmur, HTN, obesity,  lumbar back surgery 2017 PAIN:  Are you having pain? Yes: NPRS scale: 5-6/10 at rest  and at worst 10/10 Pain location: Left on back/achilles tendon. Pain description: sharp Aggravating factors: can;t stand on foot for more than 1 minute, can't walk right now at all, Sleeping is good with medicine, can't do household Relieving factors: Medication but don't like taking it Pt unable to stand in the shower PRECAUTIONS: None  RED FLAGS: None   WEIGHT BEARING RESTRICTIONS: Yes WBAT Left  FALLS:  Has patient fallen in last 6 months? No  LIVING ENVIRONMENT: Lives with: lives with their spouse and lives with their daughter Lives in: House/apartment Stairs: difficulty with stairs  now on 1st floor Has following equipment at home: Cam walking boot  OCCUPATION: work at [employer] standing most of time  PLOF: Independent  PATIENT GOALS: Get back to work at [employer], walking for exercise  NEXT MD VISIT: TBD  OBJECTIVE:  Note: Objective measures were completed at Evaluation unless otherwise noted.  DIAGNOSTIC FINDINGS: See medical record  PATIENT SURVEYS:  LEFS 20/80  25%  COGNITION: Overall cognitive status: Within functional limits for tasks assessed     SENSATION: WFL  EDEMA:  NT  MUSCLE LENGTH: Hamstrings: Bilateral tightness   POSTURE: rounded shoulders, forward head, flexed trunk , and  obese  PALPATION: TTP over achilles tendon and plantar fascia of L LE  LOWER EXTREMITY ROM:  Active ROM Right eval Left eval Left  06/15/23  Hip flexion     Hip extension     Hip abduction     Hip adduction     Hip internal rotation     Hip external rotation     Knee flexion 120 120   Knee extension 0 0   Ankle dorsiflexion 11 0 10  Ankle plantarflexion 55 52   Ankle inversion 25 21   Ankle eversion 25 11    (Blank rows = not tested)  LOWER EXTREMITY MMT:  MMT Right eval Left eval   Hip flexion    Hip extension    Hip abduction    Hip adduction    Hip internal rotation    Hip external rotation    Knee flexion 115 115  Knee extension    Ankle dorsiflexion    Ankle plantarflexion 4/25 0/25  Ankle inversion    Ankle eversion     (Blank rows = not tested)  LOWER EXTREMITY SPECIAL TESTS:  NT due to surgery  FUNCTIONAL TESTS:  2 minute walk test: TBD 30 sec STS  6 x but heavy use of hands    SL stance  0 sec on Left,  SL stance 15 sec   GAIT: Distance walked: 150 with CAM walking boot in hand /wearing regular shoe Assistive device utilized: None Level of assistance: Modified independence Comments: using CAM short boot and antalgic gait left                                                                                                                                TREATMENT DATE:  [facility] Adult PT Treatment:                                                DATE: 06/15/23 Therapeutic Exercise: 3 way red band plus PF with black band -reviewed and given cues for correct technique Toe Yoga Toe scrunches Seated heel and toe raises     [facility] Adult PT Treatment:                                                DATE: 06/13/2023  Therapeutic Activity: Ankle AROM CW/CCW 2x30s ea Seated heel raise 2x10, hold 3s Seated ankle inv/ev on towel 2x12 Great toe isolation 2x15 Towel scrunch 2x10 (d/c d/t pain) Standing weight shifts in CAM boot 1x8, 10s    PATIENT EDUCATION:  Education details: POC Explanation of findings  issue of HEP Person educated: Patient Education method: Explanation, Demonstration, Tactile cues, Verbal cues, and Handouts  Education comprehension: verbalized understanding, returned demonstration, verbal cues required, tactile cues required, and needs further education  HOME EXERCISE PROGRAM: Access Code: [email or web address] URL: [email or web address] Date: 06/05/2023 Prepared by: Sharlet Dawson  Program Notes TOe yoga,  great toe  extension followed by 2-5 digit extension  Exercises - Seated Heel Raise  - 1 x daily - 7 x weekly - 1-2 sets - 10 reps - Seated Arch Lifts  - 1 x daily - 7 x weekly - 3 sets - 10 reps - Seated Ankle Inversion with Anchored Resistance  - 1 x daily - 7 x weekly - 3 sets - 10 reps - Seated Ankle Eversion with Anchored Resistance  - 1 x daily - 7 x weekly - 3 sets - 10 reps - Seated Ankle Plantar Flexion with Resistance Loop  - 1 x daily - 7 x weekly - 3 sets - 10 reps - Seated Ankle Dorsiflexion with Resistance  - 1 x daily - 7 x weekly - 3 sets - 10 reps - Seated Heel Toe Raises  - 1 x daily - 7 x weekly - 3 sets - 10 reps  ASSESSMENT:  CLINICAL IMPRESSION: 06/15/23: Pt reports 5-6/10 pain with cam boot donned,  lower gastroc on arrival. With her HEP, she reports limited tolerance and increased pain. Reviewed 4 way ankle with band and she did require cues to complete correctly. She endorses individual attempts at standing heel raises and walking in regular shoe and was reminded that until her 6 week f/u with MD, she is to stay in Boot at all times when she is on her feet. She verbalized understanding. She does report that she was asked to stand on toes and do STS without her boot on her evaluation day, and it was explained to her that these were assessments and not activities that she should be attempting yet. Pt again verbalized understanding. DF AROM has increased from 0 to 10 degrees. MD 6 week  follow up next Thursday.    Eval Impression:Patient is a 48 y.o. female who was seen today for physical therapy evaluation and treatment for Left EPF and endoscopic gastrocnemius recession  with PRP using short CAM boot. Pt with difficulty walking and 7/10 to 10/10 pain. Pt with impairment with AROM, weakness, difficulty walking and pain and inability to perform basic household chores due to pain.  Pt will benefit from skilled PT in order to return to work at [employer].   OBJECTIVE IMPAIRMENTS: decreased  activity tolerance, decreased knowledge of condition, decreased mobility, difficulty walking, decreased ROM, decreased strength, increased fascial restrictions, postural dysfunction, obesity, and pain.   ACTIVITY LIMITATIONS: carrying, standing, squatting, stairs, transfers, and locomotion level  PARTICIPATION LIMITATIONS: meal prep, cleaning, laundry, shopping, community activity, and occupation  PERSONAL FACTORS: Heart Murmur, HTN, obesity,  lumbar back surgery 2017 are also affecting patient's functional outcome.   REHAB POTENTIAL: Good  CLINICAL DECISION MAKING: Evolving/moderate complexity  EVALUATION COMPLEXITY: Moderate   GOALS: Goals reviewed with patient? Yes  SHORT TERM GOALS: Target date: 07-03-23 Pt will be I with initial HEP Baseline:no knowledge Goal status: INITIAL  2.  Pt pain in L LE to decrease by 25% Baseline: at worst 10/10  Goal status: INITIAL  3.  Pt will be able to ambulate without CAM boot  Baseline: using short CAM boot at evaluation Goal status: INITIAL    LONG TERM GOALS: Target date: 07-31-23  Pt will be independent with advanced HEP Baseline: no knowledge Goal status: INITIAL  2.  Pt  pain level negotiating steps will be 3/10 or less Baseline: unable to negotiate steps.  7-10/10 Goal status: INITIAL  3.  Pt will be able to perform household chores for at least 15 min without break Baseline: unable to perform chores cue to inability to stand greater than 1 minute Goal status: INITIAL  4.  Pt will be able to walk for 20 minutes to begin walking exercise program Baseline: unable to walk without CAM walking boot Goal status: INITIAL  5.  Pt will be able to shop without dependence on shopping scooter Baseline:  Goal status: INITIAL  6.  Pt will improve LEFS to at least 40/80 50% Baseline: 20/80 25 % eval Goal status: INITIAL   PLAN:  PT FREQUENCY: 1-2x/week  PT DURATION: 8 weeks  PLANNED INTERVENTIONS: 97164- PT Re-evaluation,  97750- Physical Performance Testing, 97110-Therapeutic exercises, 97530- Therapeutic activity, [id number]- Neuromuscular re-education, 97535- Self Care, 19147- Manual therapy, [id number]- Gait training, [phone]- Electrical stimulation (manual), Patient/Family education, Stair training, Taping, Dry Needling, Joint mobilization, Cryotherapy, and Moist heat  PLAN FOR NEXT SESSION: progress exercise and AROM of L LE , Do 2 minute or 6 minute Walk test as tolerated   Gasper Karst, PTA 06/15/23 12:29 PM Phone: [phone] Fax: [phone]

## 2023-06-19 ENCOUNTER — Ambulatory Visit: Admitting: Physical Therapy

## 2023-06-19 DIAGNOSIS — R262 Difficulty in walking, not elsewhere classified: Secondary | ICD-10-CM

## 2023-06-19 DIAGNOSIS — M6281 Muscle weakness (generalized): Secondary | ICD-10-CM

## 2023-06-19 DIAGNOSIS — M25572 Pain in left ankle and joints of left foot: Secondary | ICD-10-CM

## 2023-06-21 ENCOUNTER — Ambulatory Visit (INDEPENDENT_AMBULATORY_CARE_PROVIDER_SITE_OTHER): Admitting: Podiatry

## 2023-06-21 ENCOUNTER — Encounter: Payer: Self-pay | Admitting: Podiatry

## 2023-06-21 VITALS — Ht 69.0 in | Wt 221.0 lb

## 2023-06-21 DIAGNOSIS — M62462 Contracture of muscle, left lower leg: Secondary | ICD-10-CM

## 2023-06-21 DIAGNOSIS — M722 Plantar fascial fibromatosis: Secondary | ICD-10-CM

## 2023-06-22 ENCOUNTER — Encounter: Payer: Self-pay | Admitting: Physical Therapy

## 2023-06-22 ENCOUNTER — Ambulatory Visit: Admitting: Physical Therapy

## 2023-06-22 ENCOUNTER — Encounter: Payer: Self-pay | Admitting: Podiatry

## 2023-06-22 DIAGNOSIS — R262 Difficulty in walking, not elsewhere classified: Secondary | ICD-10-CM

## 2023-06-22 DIAGNOSIS — M6281 Muscle weakness (generalized): Secondary | ICD-10-CM | POA: Diagnosis not present

## 2023-06-22 DIAGNOSIS — M25572 Pain in left ankle and joints of left foot: Secondary | ICD-10-CM

## 2023-06-22 NOTE — Telephone Encounter (Signed)
 Pt came into office for RTW letter. See prev notes of last one was for 07/07/23. I revised RTW date till 09/06/23. She is still take PT. I gave pt letter for her ins as well as a copy for her records.

## 2023-06-22 NOTE — Therapy (Signed)
 OUTPATIENT PHYSICAL THERAPY LOWER EXTREMITY TREATMENT   Patient Name: Sara Erickson MRN: [id number] DOB:Mar 17, 1975, 48 y.o., female Today's Date: 06/22/2023  END OF SESSION:  PT End of Session - 06/22/23 1147     Visit Number 5    Number of Visits 16    Date for PT Re-Evaluation 07/31/23    Authorization Type [employer]    PT Start Time 1147    PT Stop Time 1230    PT Time Calculation (min) 43 min              Past Medical History:  Diagnosis Date   Anemia    Hypertension    Murmur 09/17/2014   Obesity (BMI 30.0-34.9) 09/17/2014   Past Surgical History:  Procedure Laterality Date   HYSTEROSCOPY WITH NOVASURE N/A 01/20/2022   Procedure: DIAGNOSTIC HYSTEROSCOPY WITH POLYPECTOMY;  NOVASURE ENDOMETRIAL ABLATION;  Surgeon: Ivery Marking, MD;  Location: [facility] [place];  Service: Gynecology;  Laterality: N/A;   LUMBAR DISC SURGERY  2017   Patient Active Problem List   Diagnosis Date Noted   IDA (iron deficiency anemia) 01/11/2022   Essential hypertension 09/17/2014   Murmur 09/17/2014   Obesity (BMI 30.0-34.9) 09/17/2014    PCP: Calton Catholic, PA-C   REFERRING PROVIDER: Floyce Hutching, DPM   REFERRING DIAG:  [phone] (ICD-10-CM) - Gastrocnemius equinus of left lower extremity  M72.2 (ICD-10-CM) - Plantar fasciitis, bilateral    THERAPY DIAG:  Muscle weakness (generalized)  Pain in left ankle and joints of left foot  Difficulty in walking, not elsewhere classified  Rationale for Evaluation and Treatment: Rehabilitation  ONSET DATE: 05-11-23 Surgery  SUBJECTIVE:   SUBJECTIVE STATEMENT: Pt reports no pain at rest. Pain occurs with weight bearing. Saw MD who said to wear boot for longer distances, otherwise wear normal shoes.    Pt attended today's session with reports of 7/10 pain.  Last night I needed to take a pain pill and I feel like I am getting knots in the bottom of my foot. Pt stated that they have maintained fair compliance with  current HEP.  Says that she can't do a lot of the exercises because it is too much and hurts a lot. She says she can't wait to get out of the boot. She is concerned about standing more than 15 minutes now to wash dishes with the boot now.   I had the Left EPF and endoscopic gastrocnemius recession  with PRP Surgery. 05-11-23, this Friday will be 4 weeks. I can't stand or walk for more than 1 minute.  I hurt so much. I have a boot to wear. I work and stand for long hours at [employer] and I cannot stand standing for even one minute. I can't shop without using a motorized cart.  PERTINENT HISTORY: Heart Murmur, HTN, obesity,  lumbar back surgery 2017 PAIN:  Are you having pain? Yes: NPRS scale: 0/10 at rest  and at worst 10/10 Pain location: Left on back/achilles tendon. Pain description: sharp Aggravating factors: can;t stand on foot for more than 1 minute, can't walk right now at all, Sleeping is good with medicine, can't do household Relieving factors: Medication but don't like taking it Pt unable to stand in the shower PRECAUTIONS: None  RED FLAGS: None   WEIGHT BEARING RESTRICTIONS: Yes WBAT Left  FALLS:  Has patient fallen in last 6 months? No  LIVING ENVIRONMENT: Lives with: lives with their spouse and lives with their daughter Lives in: House/apartment Stairs: difficulty with  stairs  now on 1st floor Has following equipment at home: Cam walking boot  OCCUPATION: work at [employer] standing most of time  PLOF: Independent  PATIENT GOALS: Get back to work at [employer], walking for exercise  NEXT MD VISIT: TBD  OBJECTIVE:  Note: Objective measures were completed at Evaluation unless otherwise noted.  DIAGNOSTIC FINDINGS: See medical record  PATIENT SURVEYS:  LEFS 20/80  25%  COGNITION: Overall cognitive status: Within functional limits for tasks assessed     SENSATION: WFL  EDEMA:  NT  MUSCLE LENGTH: Hamstrings: Bilateral tightness   POSTURE: rounded shoulders,  forward head, flexed trunk , and obese  PALPATION: TTP over achilles tendon and plantar fascia of L LE  LOWER EXTREMITY ROM:  Active ROM Right eval Left eval Left  Hip flexion     Hip extension     Hip abduction     Hip adduction     Hip internal rotation     Hip external rotation     Knee flexion 120 120   Knee extension 0 0   Ankle dorsiflexion 11 0 10  Ankle plantarflexion 55 52   Ankle inversion 25 21   Ankle eversion 25 11    (Blank rows = not tested)  LOWER EXTREMITY MMT:  MMT Right eval Left eval  Hip flexion    Hip extension    Hip abduction    Hip adduction    Hip internal rotation    Hip external rotation    Knee flexion 115 115  Knee extension    Ankle dorsiflexion    Ankle plantarflexion 4/25 0/25  Ankle inversion    Ankle eversion     (Blank rows = not tested)  LOWER EXTREMITY SPECIAL TESTS:  NT due to surgery  FUNCTIONAL TESTS:  2 minute walk test: TBD 30 sec STS  6 x but heavy use of hands    SL stance  0 sec on Left,  SL stance 15 sec   GAIT: Distance walked: 150 with CAM walking boot in hand /wearing regular shoe Assistive device utilized: None Level of assistance: Modified independence Comments: using CAM short boot and antalgic gait left                                                                                                                                TREATMENT DATE:  [facility] Adult PT Treatment:                                                DATE: 06/22/23 Therapeutic Exercise: Seated PF Black band x 10  Seated Heel raise with 10# on thigh  x 10  Seated toe yoga, towel scrunches, towelv/ev Seated DF stretch with towel  Manual Therapy: STW to lower calf-very tender Therapeutic Activity: Gait in shoes- added  single crutch due to pain and poor gait mechanics  Modalities: Ice pack to lower calf     [facility] Adult PT Treatment:                                                DATE: 06-19-23 Therapeutic Exercise: Ankle AROM  CW/CCW 2x30s ea Pt reports doing all the time Seated heel raise 2x10, hold 3s Seated ankle inv/ev on towel 2x12 Great toe isolation 2x15 Towel scrunch attempted 3 x but DC Standing weight shifts in CAM boot forward and backward and side to side GTB Left inversion 3x 10 GTB Left eversion 3 x 10 Added sidelying left hip abduction 2 x 10 Added Squats 2 x 10 Manual - PT added gentle DF stretch and gentle mobs of Metatarsal and tarsals   [facility] Adult PT Treatment:                                                DATE: 06/13/2023  Therapeutic Activity: Ankle AROM CW/CCW 2x30s ea Seated heel raise 2x10, hold 3s Seated ankle inv/ev on towel 2x12 Great toe isolation 2x15 Towel scrunch 2x10 (d/c d/t pain) Standing weight shifts in CAM boot 1x8, 10s      PATIENT EDUCATION:  Education details: POC Explanation of findings  issue of HEP Person educated: Patient Education method: Explanation, Demonstration, Tactile cues, Verbal cues, and Handouts Education comprehension: verbalized understanding, returned demonstration, verbal cues required, tactile cues required, and needs further education  HOME EXERCISE PROGRAM: Access Code: [email or web address] URL: [email or web address] Date: 06/05/2023 Prepared by: Sharlet Dawson  Program Notes TOe yoga,  great toe extension followed by 2-5 digit extension  Exercises - Seated Heel Raise  - 1 x daily - 7 x weekly - 1-2 sets - 10 reps - Seated Arch Lifts  - 1 x daily - 7 x weekly - 3 sets - 10 reps - Seated Ankle Inversion with Anchored Resistance  - 1 x daily - 7 x weekly - 3 sets - 10 reps - Seated Ankle Eversion with Anchored Resistance  - 1 x daily - 7 x weekly - 3 sets - 10 reps - Seated Ankle Plantar Flexion with Resistance Loop  - 1 x daily - 7 x weekly - 3 sets - 10 reps - Seated Ankle Dorsiflexion with Resistance  - 1 x daily - 7 x weekly - 3 sets - 10 reps - Seated Heel Toe Raises  - 1 x daily - 7 x weekly - 3 sets - 10  reps Added 06-19-23  - Squat with Chair and Counter Support  - 1 x daily - 7 x weekly - 3 sets - 10 reps - sidelying Hip Abduction with ball under neck for support  - 1 x daily - 7 x weekly - 3 sets - 10 reps ASSESSMENT:  CLINICAL IMPRESSION: Pt attended physical therapy session for continuation of treatment regarding Left EPF and endoscopic gastrocnemius recession . Today's treatment focused on improvement of  Left ankle/foot mobility and strength, WB tolerance, and intrinsic foot strength.Pt saw MD yesterday who discontinued boot except for long distances. Pt reports no pain at rest however has 6-7/10 pain with weight bearing whether in boot or not. She has antalgic gait  pattern and poor tolerance to walking. Added single crutch to assist with pain and gait mechanics. Provided STW to lower calf with min improvement. Ice pack placed at lower calf at end of session to decrease pain.    Eval Impression:Patient is a 48 y.o. female who was seen today for physical therapy evaluation and treatment for Left EPF and endoscopic gastrocnemius recession  with PRP using short CAM boot. Pt with difficulty walking and 7/10 to 10/10 pain. Pt with impairment with AROM, weakness, difficulty walking and pain and inability to perform basic household chores due to pain.  Pt will benefit from skilled PT in order to return to work at [employer].   OBJECTIVE IMPAIRMENTS: decreased activity tolerance, decreased knowledge of condition, decreased mobility, difficulty walking, decreased ROM, decreased strength, increased fascial restrictions, postural dysfunction, obesity, and pain.   ACTIVITY LIMITATIONS: carrying, standing, squatting, stairs, transfers, and locomotion level  PARTICIPATION LIMITATIONS: meal prep, cleaning, laundry, shopping, community activity, and occupation  PERSONAL FACTORS: Heart Murmur, HTN, obesity,  lumbar back surgery 2017 are also affecting patient's functional outcome.   REHAB POTENTIAL:  Good  CLINICAL DECISION MAKING: Evolving/moderate complexity  EVALUATION COMPLEXITY: Moderate   GOALS: Goals reviewed with patient? Yes  SHORT TERM GOALS: Target date: 07-03-23 Pt will be I with initial HEP Baseline:no knowledge 06-19-23  Compliant with exercises at home Goal status: MET  2.  Pt pain in L LE to decrease by 25% Baseline: at worst 10/10  06-19-23 7/10 mostly at home relaxed 4-5/10 Goal status: MET  3.  Pt will be able to ambulate without CAM boot  Baseline: using short CAM boot at evaluation 06-19-23 Still weaning from CAM boot, unable to step without boot needing it  for balance Goal status:ONGOING.    LONG TERM GOALS: Target date: 07-31-23  Pt will be independent with advanced HEP Baseline: no knowledge Goal status: INITIAL  2.  Pt pain level negotiating steps will be 3/10 or less Baseline: unable to negotiate steps.  7-10/10 Goal status: INITIAL  3.  Pt will be able to perform household chores for at least 15 min without break Baseline: unable to perform chores cue to inability to stand greater than 1 minute Goal status: INITIAL  4.  Pt will be able to walk for 20 minutes to begin walking exercise program Baseline: unable to walk without CAM walking boot Goal status: INITIAL  5.  Pt will be able to shop without dependence on shopping scooter Baseline:  Goal status: INITIAL  6.  Pt will improve LEFS to at least 40/80 50% Baseline: 20/80 25 % eval Goal status: INITIAL   PLAN:  PT FREQUENCY: 1-2x/week  PT DURATION: 8 weeks  PLANNED INTERVENTIONS: 97164- PT Re-evaluation, 97750- Physical Performance Testing, 97110-Therapeutic exercises, 97530- Therapeutic activity, [id number]- Neuromuscular re-education, 97535- Self Care, [id number]- Manual therapy, [id number]- Gait training, [phone]- Electrical stimulation (manual), Patient/Family education, Stair training, Taping, Dry Needling, Joint mobilization, Cryotherapy, and Moist heat  PLAN FOR NEXT SESSION: progress  exercise and AROM of L LE , Do 2 minute or 6 minute Walk test as tolerated   Sharlet Dawson, PT, ATR[facility] Certified Exercise Expert for the Aging Adult  06/22/23 12:30 PM Phone: [phone] Fax: [phone]

## 2023-06-24 NOTE — Progress Notes (Signed)
  Subjective:  Patient ID: Sara Erickson, female    DOB: [birth year],  MRN: [id number]  Chief Complaint  Patient presents with   Foot Pain    Rm 14 T EPF,LT CALF LENGTHENING, PRP (NO XRAYS) Pt states left foot is weak and swollen.    DOS: 05/11/2023 Procedure: Left EPF and endoscopic gastrocnemius recession  48 y.o. female returns for post-op check.    Review of Systems: Negative except as noted in the HPI. Denies N/V/F/Ch.   Objective:  There were no vitals filed for this visit. Body mass index is 32.64 kg/m. Constitutional Well developed. Well nourished.  Vascular Foot warm and well perfused. Capillary refill normal to all digits.  Calf is soft and supple, no posterior calf or knee pain, negative Homans' sign  Neurologic Normal speech. Oriented to person, place, and time. Epicritic sensation to light touch grossly present bilaterally.  Dermatologic S incision is well-healed without hypertrophy  Orthopedic: Tenderness to palpation noted about the surgical site.  4 out of 5 strength    Assessment:   1. Gastrocnemius equinus of left lower extremity   2. Plantar fasciitis, bilateral     Plan:  Patient was evaluated and treated and all questions answered.  S/p foot surgery left - Can transition out of CAM boot.  Continue PT.  Likely will need to remain out of work until July 1.  Follow-up with me in 6 weeks to reevaluate prior to this  Return in about 6 weeks (around 08/02/2023) for post op (no x-rays).

## 2023-06-25 NOTE — Telephone Encounter (Signed)
 Dr. Michalene Agee had sent a message to do note for pt to be out of work until 7//1/25 See prev notes of note already done for RTW 09/06/23.

## 2023-06-26 ENCOUNTER — Encounter: Payer: Self-pay | Admitting: Physical Therapy

## 2023-06-26 ENCOUNTER — Telehealth: Payer: Self-pay

## 2023-06-26 ENCOUNTER — Ambulatory Visit: Admitting: Physical Therapy

## 2023-06-26 DIAGNOSIS — M25572 Pain in left ankle and joints of left foot: Secondary | ICD-10-CM

## 2023-06-26 DIAGNOSIS — M6281 Muscle weakness (generalized): Secondary | ICD-10-CM | POA: Diagnosis not present

## 2023-06-26 DIAGNOSIS — R262 Difficulty in walking, not elsewhere classified: Secondary | ICD-10-CM

## 2023-06-26 NOTE — Telephone Encounter (Signed)
 [facility] patient -Spoke with her - she is out of the boot, having pain on both sides of her ankle and having calf pain - PT is very difficult for her -Advised she will likely need to go back into the boot  Last note states:  S/p foot surgery left - Can transition out of CAM boot.  Continue PT.  Likely will need to remain out of work until July 1.  Follow-up with me in 6 weeks to reevaluate prior to this   Anything else I can suggest for her?

## 2023-06-26 NOTE — Therapy (Signed)
 OUTPATIENT PHYSICAL THERAPY LOWER EXTREMITY TREATMENT   Patient Name: Sara Erickson MRN: [id number] DOB:1976/01/27, 48 y.o., female Today's Date: 06/26/2023  END OF SESSION:  PT End of Session - 06/26/23 1020     Visit Number 6    Number of Visits 16    Date for PT Re-Evaluation 07/31/23    Authorization Type [employer]    PT Start Time 1018    PT Stop Time 1100    PT Time Calculation (min) 42 min              Past Medical History:  Diagnosis Date   Anemia    Hypertension    Murmur 09/17/2014   Obesity (BMI 30.0-34.9) 09/17/2014   Past Surgical History:  Procedure Laterality Date   HYSTEROSCOPY WITH NOVASURE N/A 01/20/2022   Procedure: DIAGNOSTIC HYSTEROSCOPY WITH POLYPECTOMY;  NOVASURE ENDOMETRIAL ABLATION;  Surgeon: Ivery Marking, MD;  Location: [facility] [place];  Service: Gynecology;  Laterality: N/A;   LUMBAR DISC SURGERY  2017   Patient Active Problem List   Diagnosis Date Noted   IDA (iron deficiency anemia) 01/11/2022   Essential hypertension 09/17/2014   Murmur 09/17/2014   Obesity (BMI 30.0-34.9) 09/17/2014    PCP: Calton Catholic, PA-C   REFERRING PROVIDER: Floyce Hutching, DPM   REFERRING DIAG:  [phone] (ICD-10-CM) - Gastrocnemius equinus of left lower extremity  M72.2 (ICD-10-CM) - Plantar fasciitis, bilateral    THERAPY DIAG:  Muscle weakness (generalized)  Pain in left ankle and joints of left foot  Difficulty in walking, not elsewhere classified  Rationale for Evaluation and Treatment: Rehabilitation  ONSET DATE: 05-11-23 Surgery  SUBJECTIVE:   SUBJECTIVE STATEMENT: 06/26/23: 7/10 pain most of the time, especially with walking. Having pain at night. Electric shocks occur in foot up into calf.  I've been trying to get in touch with my doctor since I left last week. They called me back this morning. Still waiting on what to do about this pain. They might put me back in the boot. I walk better in the boot but it still hurts  in the boot. But I feel better balance in the boot.    06/22/23: Pt reports no pain at rest. Pain occurs with weight bearing. Saw MD who said to wear boot for longer distances, otherwise wear normal shoes.    Pt attended today's session with reports of 7/10 pain.  Last night I needed to take a pain pill and I feel like I am getting knots in the bottom of my foot. Pt stated that they have maintained fair compliance with current HEP.  Says that she can't do a lot of the exercises because it is too much and hurts a lot. She says she can't wait to get out of the boot. She is concerned about standing more than 15 minutes now to wash dishes with the boot now.   I had the Left EPF and endoscopic gastrocnemius recession  with PRP Surgery. 05-11-23, this Friday will be 4 weeks. I can't stand or walk for more than 1 minute.  I hurt so much. I have a boot to wear. I work and stand for long hours at [employer] and I cannot stand standing for even one minute. I can't shop without using a motorized cart.  PERTINENT HISTORY: Heart Murmur, HTN, obesity,  lumbar back surgery 2017 PAIN:  Are you having pain? Yes: NPRS scale: 0/10 at rest  and at worst 10/10 Pain location: Left on back/achilles tendon. Pain description:  sharp Aggravating factors: can;t stand on foot for more than 1 minute, can't walk right now at all, Sleeping is good with medicine, can't do household Relieving factors: Medication but don't like taking it Pt unable to stand in the shower PRECAUTIONS: None  RED FLAGS: None   WEIGHT BEARING RESTRICTIONS: Yes WBAT Left  FALLS:  Has patient fallen in last 6 months? No  LIVING ENVIRONMENT: Lives with: lives with their spouse and lives with their daughter Lives in: House/apartment Stairs: difficulty with stairs  now on 1st floor Has following equipment at home: Cam walking boot  OCCUPATION: work at [employer] standing most of time  PLOF: Independent  PATIENT GOALS: Get back to work at [employer],  walking for exercise  NEXT MD VISIT: TBD  OBJECTIVE:  Note: Objective measures were completed at Evaluation unless otherwise noted.  DIAGNOSTIC FINDINGS: See medical record  PATIENT SURVEYS:  LEFS 20/80  25%  COGNITION: Overall cognitive status: Within functional limits for tasks assessed     SENSATION: WFL  EDEMA:  NT  MUSCLE LENGTH: Hamstrings: Bilateral tightness   POSTURE: rounded shoulders, forward head, flexed trunk , and obese  PALPATION: TTP over achilles tendon and plantar fascia of L LE  LOWER EXTREMITY ROM:  Active ROM Right eval Left eval Left 06/19/23  Hip flexion     Hip extension     Hip abduction     Hip adduction     Hip internal rotation     Hip external rotation     Knee flexion 120 120   Knee extension 0 0   Ankle dorsiflexion 11 0 10  Ankle plantarflexion 55 52   Ankle inversion 25 21   Ankle eversion 25 11    (Blank rows = not tested)  LOWER EXTREMITY MMT:  MMT Right eval Left eval  Hip flexion    Hip extension    Hip abduction    Hip adduction    Hip internal rotation    Hip external rotation    Knee flexion 115 115  Knee extension    Ankle dorsiflexion    Ankle plantarflexion 4/25 0/25  Ankle inversion    Ankle eversion     (Blank rows = not tested)  LOWER EXTREMITY SPECIAL TESTS:  NT due to surgery  FUNCTIONAL TESTS:  2 minute walk test: TBD 30 sec STS  6 x but heavy use of hands    SL stance  0 sec on Left,  SL stance 15 sec   GAIT: Distance walked: 150 with CAM walking boot in hand /wearing regular shoe Assistive device utilized: None Level of assistance: Modified independence Comments: using CAM short boot and antalgic gait left                                                                                                                                TREATMENT DATE:  [facility] Adult PT Treatment:  DATE: 06/26/23 Therapeutic Exercise: Passive gastroc and soleus  stretch prone Rec Bikel L1 x 5 minutes Manual Therapy: STW to left calf prone  Modalities: concurrent with self care HMP to left calf   Self Care: Stretching, massage, use of tennis ball for self TPR to calf in long sitting     [facility] Adult PT Treatment:                                                DATE: 06/22/23 Therapeutic Exercise: Seated PF Black band x 10  Seated Heel raise with 10# on thigh  x 10  Seated toe yoga, towel scrunches, towelv/ev Seated DF stretch with towel  Manual Therapy: STW to lower calf-very tender Therapeutic Activity: Gait in shoes- added single crutch due to pain and poor gait mechanics  Modalities: Ice pack to lower calf     [facility] Adult PT Treatment:                                                DATE: 06-19-23 Therapeutic Exercise: Ankle AROM CW/CCW 2x30s ea Pt reports doing all the time Seated heel raise 2x10, hold 3s Seated ankle inv/ev on towel 2x12 Great toe isolation 2x15 Towel scrunch attempted 3 x but DC Standing weight shifts in CAM boot forward and backward and side to side GTB Left inversion 3x 10 GTB Left eversion 3 x 10 Added sidelying left hip abduction 2 x 10 Added Squats 2 x 10 Manual - PT added gentle DF stretch and gentle mobs of Metatarsal and tarsals   [facility] Adult PT Treatment:                                                DATE: 06/13/2023  Therapeutic Activity: Ankle AROM CW/CCW 2x30s ea Seated heel raise 2x10, hold 3s Seated ankle inv/ev on towel 2x12 Great toe isolation 2x15 Towel scrunch 2x10 (d/c d/t pain) Standing weight shifts in CAM boot 1x8, 10s      PATIENT EDUCATION:  Education details: POC Explanation of findings  issue of HEP Person educated: Patient Education method: Explanation, Demonstration, Tactile cues, Verbal cues, and Handouts Education comprehension: verbalized understanding, returned demonstration, verbal cues required, tactile cues required, and needs further education  HOME  EXERCISE PROGRAM: Access Code: [email or web address] URL: [email or web address] Date: 06/05/2023 Prepared by: Sharlet Dawson  Program Notes TOe yoga,  great toe extension followed by 2-5 digit extension  Exercises - Seated Heel Raise  - 1 x daily - 7 x weekly - 1-2 sets - 10 reps - Seated Arch Lifts  - 1 x daily - 7 x weekly - 3 sets - 10 reps - Seated Ankle Inversion with Anchored Resistance  - 1 x daily - 7 x weekly - 3 sets - 10 reps - Seated Ankle Eversion with Anchored Resistance  - 1 x daily - 7 x weekly - 3 sets - 10 reps - Seated Ankle Plantar Flexion with Resistance Loop  - 1 x daily - 7 x weekly - 3 sets - 10 reps - Seated Ankle Dorsiflexion with Resistance  - 1  x daily - 7 x weekly - 3 sets - 10 reps - Seated Heel Toe Raises  - 1 x daily - 7 x weekly - 3 sets - 10 reps Added 06-19-23  - Squat with Chair and Counter Support  - 1 x daily - 7 x weekly - 3 sets - 10 reps - sidelying Hip Abduction with ball under neck for support  - 1 x daily - 7 x weekly - 3 sets - 10 reps ASSESSMENT:  CLINICAL IMPRESSION: Has been using boot again due to pain. Pt reports she felt short term relief after last session. Has tried to massage her calf herself. She is experiencing electric shocks,  and feeling like her lower leg is weak from the ankle down; heavy and dragging. She has normal DF. Tried Rec bike which she tolerated well. Repeated STW and tried HMP today which provided some relief afterward. She is waiting on call back from MD regarding her ongoing pain.    06/22/23: Pt attended physical therapy session for continuation of treatment regarding Left EPF and endoscopic gastrocnemius recession . Today's treatment focused on improvement of  Left ankle/foot mobility and strength, WB tolerance, and intrinsic foot strength.Pt saw MD yesterday who discontinued boot except for long distances. Pt reports no pain at rest however has 6-7/10 pain with weight bearing whether in boot or not. She has  antalgic gait pattern and poor tolerance to walking. Added single crutch to assist with pain and gait mechanics. Provided STW to lower calf with min improvement. Ice pack placed at lower calf at end of session to decrease pain.    Eval Impression:Patient is a 48 y.o. female who was seen today for physical therapy evaluation and treatment for Left EPF and endoscopic gastrocnemius recession  with PRP using short CAM boot. Pt with difficulty walking and 7/10 to 10/10 pain. Pt with impairment with AROM, weakness, difficulty walking and pain and inability to perform basic household chores due to pain.  Pt will benefit from skilled PT in order to return to work at [employer].   OBJECTIVE IMPAIRMENTS: decreased activity tolerance, decreased knowledge of condition, decreased mobility, difficulty walking, decreased ROM, decreased strength, increased fascial restrictions, postural dysfunction, obesity, and pain.   ACTIVITY LIMITATIONS: carrying, standing, squatting, stairs, transfers, and locomotion level  PARTICIPATION LIMITATIONS: meal prep, cleaning, laundry, shopping, community activity, and occupation  PERSONAL FACTORS: Heart Murmur, HTN, obesity,  lumbar back surgery 2017 are also affecting patient's functional outcome.   REHAB POTENTIAL: Good  CLINICAL DECISION MAKING: Evolving/moderate complexity  EVALUATION COMPLEXITY: Moderate   GOALS: Goals reviewed with patient? Yes  SHORT TERM GOALS: Target date: 07-03-23 Pt will be I with initial HEP Baseline:no knowledge 06-19-23  Compliant with exercises at home Goal status: MET  2.  Pt pain in L LE to decrease by 25% Baseline: at worst 10/10  06-19-23 7/10 mostly at home relaxed 4-5/10 Goal status: MET  3.  Pt will be able to ambulate without CAM boot  Baseline: using short CAM boot at evaluation 06-19-23 Still weaning from CAM boot, unable to step without boot needing it  for balance 06/26/23: poor tolerance 7/10 pain Goal  status:ONGOING.    LONG TERM GOALS: Target date: 07-31-23  Pt will be independent with advanced HEP Baseline: no knowledge Goal status: INITIAL  2.  Pt pain level negotiating steps will be 3/10 or less Baseline: unable to negotiate steps.  7-10/10 Goal status: INITIAL  3.  Pt will be able to perform household chores for  at least 15 min without break Baseline: unable to perform chores cue to inability to stand greater than 1 minute Goal status: INITIAL  4.  Pt will be able to walk for 20 minutes to begin walking exercise program Baseline: unable to walk without CAM walking boot Goal status: INITIAL  5.  Pt will be able to shop without dependence on shopping scooter Baseline:  Goal status: INITIAL  6.  Pt will improve LEFS to at least 40/80 50% Baseline: 20/80 25 % eval Goal status: INITIAL   PLAN:  PT FREQUENCY: 1-2x/week  PT DURATION: 8 weeks  PLANNED INTERVENTIONS: 97164- PT Re-evaluation, 97750- Physical Performance Testing, 97110-Therapeutic exercises, 97530- Therapeutic activity, [id number]- Neuromuscular re-education, 97535- Self Care, [id number]- Manual therapy, [id number]- Gait training, [phone]- Electrical stimulation (manual), Patient/Family education, Stair training, Taping, Dry Needling, Joint mobilization, Cryotherapy, and Moist heat  PLAN FOR NEXT SESSION: progress exercise and AROM of L LE , Do 2 minute or 6 minute Walk test as tolerated   Gasper Karst, PTA 06/26/23 11:14 AM Phone: [phone] Fax: [phone]

## 2023-06-27 NOTE — Therapy (Signed)
 OUTPATIENT PHYSICAL THERAPY LOWER EXTREMITY TREATMENT   Patient Name: Sara Erickson MRN: [id number] DOB:01/04/1976, 48 y.o., female Today's Date: 06/28/2023  END OF SESSION:  PT End of Session - 06/28/23 1221     Visit Number 7    Number of Visits 16    Date for PT Re-Evaluation 07/31/23    Authorization Type [employer]    PT Start Time 1145    PT Stop Time 1230    PT Time Calculation (min) 45 min    Activity Tolerance Patient tolerated treatment well;Patient limited by pain    Behavior During Therapy [facility] for tasks assessed/performed               Past Medical History:  Diagnosis Date   Anemia    Hypertension    Murmur 09/17/2014   Obesity (BMI 30.0-34.9) 09/17/2014   Past Surgical History:  Procedure Laterality Date   HYSTEROSCOPY WITH NOVASURE N/A 01/20/2022   Procedure: DIAGNOSTIC HYSTEROSCOPY WITH POLYPECTOMY;  NOVASURE ENDOMETRIAL ABLATION;  Surgeon: Ivery Marking, MD;  Location: [facility];  Service: Gynecology;  Laterality: N/A;   LUMBAR DISC SURGERY  2017   Patient Active Problem List   Diagnosis Date Noted   IDA (iron deficiency anemia) 01/11/2022   Essential hypertension 09/17/2014   Murmur 09/17/2014   Obesity (BMI 30.0-34.9) 09/17/2014    PCP: Calton Catholic, PA-C   REFERRING PROVIDER: Floyce Hutching, DPM   REFERRING DIAG:  [phone] (ICD-10-CM) - Gastrocnemius equinus of left lower extremity  M72.2 (ICD-10-CM) - Plantar fasciitis, bilateral    THERAPY DIAG:  Muscle weakness (generalized)  Pain in left ankle and joints of left foot  Difficulty in walking, not elsewhere classified  Rationale for Evaluation and Treatment: Rehabilitation  ONSET DATE: 05-11-23 Surgery  SUBJECTIVE:   SUBJECTIVE STATEMENT: 06/28/23: 4/10 pain most of the time, especially with walking.  Pt is now back in CAM boot full time and will wait for MD to progress. Continuing to have pain at night with 7/10.   06/22/23: Pt reports no pain at  rest. Pain occurs with weight bearing. Saw MD who said to wear boot for longer distances, otherwise wear normal shoes.    Pt attended today's session with reports of 7/10 pain.  Last night I needed to take a pain pill and I feel like I am getting knots in the bottom of my foot. Pt stated that they have maintained fair compliance with current HEP.  Says that she can't do a lot of the exercises because it is too much and hurts a lot. She says she can't wait to get out of the boot. She is concerned about standing more than 15 minutes now to wash dishes with the boot now.   I had the Left EPF and endoscopic gastrocnemius recession  with PRP Surgery. 05-11-23, this Friday will be 4 weeks. I can't stand or walk for more than 1 minute.  I hurt so much. I have a boot to wear. I work and stand for long hours at [employer] and I cannot stand standing for even one minute. I can't shop without using a motorized cart.  PERTINENT HISTORY: Heart Murmur, HTN, obesity,  lumbar back surgery 2017 PAIN:  Are you having pain? Yes: NPRS scale: 0/10 at rest  and at worst 10/10 Pain location: Left on back/achilles tendon. Pain description: sharp Aggravating factors: can;t stand on foot for more than 1 minute, can't walk right now at all, Sleeping is good with medicine, can't do  household Relieving factors: Medication but don't like taking it Pt unable to stand in the shower PRECAUTIONS: None  RED FLAGS: None   WEIGHT BEARING RESTRICTIONS: Yes WBAT Left  FALLS:  Has patient fallen in last 6 months? No  LIVING ENVIRONMENT: Lives with: lives with their spouse and lives with their daughter Lives in: House/apartment Stairs: difficulty with stairs  now on 1st floor Has following equipment at home: Cam walking boot  OCCUPATION: work at [employer] standing most of time  PLOF: Independent  PATIENT GOALS: Get back to work at [employer], walking for exercise  NEXT MD VISIT: TBD  OBJECTIVE:  Note: Objective measures were  completed at Evaluation unless otherwise noted.  DIAGNOSTIC FINDINGS: See medical record  PATIENT SURVEYS:  LEFS 20/80  25%  COGNITION: Overall cognitive status: Within functional limits for tasks assessed     SENSATION: WFL  EDEMA:  NT  MUSCLE LENGTH: Hamstrings: Bilateral tightness   POSTURE: rounded shoulders, forward head, flexed trunk , and obese  PALPATION: TTP over achilles tendon and plantar fascia of L LE  LOWER EXTREMITY ROM:  Active ROM Right eval Left eval Left 06/19/23 Left 06-28-23  Hip flexion      Hip extension      Hip abduction      Hip adduction      Hip internal rotation      Hip external rotation      Knee flexion 120 120    Knee extension 0 0    Ankle dorsiflexion 11 0 10 10  Ankle plantarflexion 55 52  52  Ankle inversion 25 21  22   Ankle eversion 25 11  15    (Blank rows = not tested)  LOWER EXTREMITY MMT:  MMT Right eval Left eval  Hip flexion    Hip extension    Hip abduction    Hip adduction    Hip internal rotation    Hip external rotation    Knee flexion 115 115  Knee extension    Ankle dorsiflexion    Ankle plantarflexion 4/25 0/25  Ankle inversion    Ankle eversion     (Blank rows = not tested)  LOWER EXTREMITY SPECIAL TESTS:  NT due to surgery  FUNCTIONAL TESTS:  2 minute walk test: TBD 30 sec STS  6 x but heavy use of hands    SL stance  0 sec on Left,  SL stance 15 sec   GAIT: Distance walked: 150 with CAM walking boot in hand /wearing regular shoe Assistive device utilized: None Level of assistance: Modified independence Comments: using CAM short boot and antalgic gait left                                                                                                                                TREATMENT DATE:  [facility] Adult PT Treatment:  DATE: 06-28-23 Remain in  CAM boot until seeing MD,  Surgery 05-11-23  Week 7 post op Therapeutic Exercise: Rec Bikel  L1 x 5 minutes PROM of gastroc and soleus stretch prone RTB PF, IN, EV and DF with no pain  2 x 10 Seated heel raise with no pain Manual Therapy: STW to left calf in prone MTPR to left calf over tender areas  Therapeutic Activity: Heel lift in the Right Ambulation with CAM boot Modalities: HMP to left calf  [facility] Adult PT Treatment:                                                DATE: 06/26/23 Therapeutic Exercise: Passive gastroc and soleus stretch prone Rec Bikel L1 x 5 minutes Manual Therapy: STW to left calf prone  Modalities: concurrent with self care HMP to left calf   Self Care: Stretching, massage, use of tennis ball for self TPR to calf in long sitting     [facility] Adult PT Treatment:                                                DATE: 06/22/23 Therapeutic Exercise: Seated PF Black band x 10  Seated Heel raise with 10# on thigh  x 10  Seated toe yoga, towel scrunches, towelv/ev Seated DF stretch with towel  Manual Therapy: STW to lower calf-very tender Therapeutic Activity: Gait in shoes- added single crutch due to pain and poor gait mechanics  Modalities: Ice pack to lower calf     [facility] Adult PT Treatment:                                                DATE: 06-19-23 Therapeutic Exercise: Ankle AROM CW/CCW 2x30s ea Pt reports doing all the time Seated heel raise 2x10, hold 3s Seated ankle inv/ev on towel 2x12 Great toe isolation 2x15 Towel scrunch attempted 3 x but DC Standing weight shifts in CAM boot forward and backward and side to side GTB Left inversion 3x 10 GTB Left eversion 3 x 10 Added sidelying left hip abduction 2 x 10 Added Squats 2 x 10 Manual - PT added gentle DF stretch and gentle mobs of Metatarsal and tarsals   [facility] Adult PT Treatment:                                                DATE: 06/13/2023  Therapeutic Activity: Ankle AROM CW/CCW 2x30s ea Seated heel raise 2x10, hold 3s Seated ankle inv/ev on towel 2x12 Great toe  isolation 2x15 Towel scrunch 2x10 (d/c d/t pain) Standing weight shifts in CAM boot 1x8, 10s      PATIENT EDUCATION:  Education details: POC Explanation of findings  issue of HEP Person educated: Patient Education method: Explanation, Demonstration, Tactile cues, Verbal cues, and Handouts Education comprehension: verbalized understanding, returned demonstration, verbal cues required, tactile cues required, and needs further education  HOME EXERCISE PROGRAM: Access Code: [email or web address] URL: [email or web address]  Date: 06/05/2023 Prepared by: Sharlet Dawson  Program Notes TOe yoga,  great toe extension followed by 2-5 digit extension  Exercises - Seated Heel Raise  - 1 x daily - 7 x weekly - 1-2 sets - 10 reps - Seated Arch Lifts  - 1 x daily - 7 x weekly - 3 sets - 10 reps - Seated Ankle Inversion with Anchored Resistance  - 1 x daily - 7 x weekly - 3 sets - 10 reps - Seated Ankle Eversion with Anchored Resistance  - 1 x daily - 7 x weekly - 3 sets - 10 reps - Seated Ankle Plantar Flexion with Resistance Loop  - 1 x daily - 7 x weekly - 3 sets - 10 reps - Seated Ankle Dorsiflexion with Resistance  - 1 x daily - 7 x weekly - 3 sets - 10 reps - Seated Heel Toe Raises  - 1 x daily - 7 x weekly - 3 sets - 10 reps Added 06-19-23  - Squat with Chair and Counter Support  - 1 x daily - 7 x weekly - 3 sets - 10 reps - sidelying Hip Abduction with ball under neck for support  - 1 x daily - 7 x weekly - 3 sets - 10 reps ASSESSMENT:  CLINICAL IMPRESSION:  Pt reports she is 4/10 today after Manual and STW last session.  She has  been trying to get in touch MD for about 10 days.  Pt was placed in CAM boot full time by PT until clarified by MD. Pt has good motion in ankle but not good activity tolerance to standing especially without CAM boot and especially in regular shoes. Pt was able to perform exercises with red t band before STW  Pt also placed heel lift in R LE to lesson  antalgic gait. She has normal DF.  Pt will progress as she is able to tolerate and after she talks with MD. Justine Oms plans to call again this Thursday afternoon before [facility]    06/22/23: Pt attended physical therapy session for continuation of treatment regarding Left EPF and endoscopic gastrocnemius recession . Today's treatment focused on improvement of  Left ankle/foot mobility and strength, WB tolerance, and intrinsic foot strength.Pt saw MD yesterday who discontinued boot except for long distances. Pt reports no pain at rest however has 6-7/10 pain with weight bearing whether in boot or not. She has antalgic gait pattern and poor tolerance to walking. Added single crutch to assist with pain and gait mechanics. Provided STW to lower calf with min improvement. Ice pack placed at lower calf at end of session to decrease pain.    Eval Impression:Patient is a 48 y.o. female who was seen today for physical therapy evaluation and treatment for Left EPF and endoscopic gastrocnemius recession  with PRP using short CAM boot. Pt with difficulty walking and 7/10 to 10/10 pain. Pt with impairment with AROM, weakness, difficulty walking and pain and inability to perform basic household chores due to pain.  Pt will benefit from skilled PT in order to return to work at [employer].  Surgery 05-11-23  OBJECTIVE IMPAIRMENTS: decreased activity tolerance, decreased knowledge of condition, decreased mobility, difficulty walking, decreased ROM, decreased strength, increased fascial restrictions, postural dysfunction, obesity, and pain.   ACTIVITY LIMITATIONS: carrying, standing, squatting, stairs, transfers, and locomotion level  PARTICIPATION LIMITATIONS: meal prep, cleaning, laundry, shopping, community activity, and occupation  PERSONAL FACTORS: Heart Murmur, HTN, obesity,  lumbar back surgery 2017 are also affecting patient's  functional outcome.   REHAB POTENTIAL: Good  CLINICAL DECISION MAKING:  Evolving/moderate complexity  EVALUATION COMPLEXITY: Moderate   GOALS: Goals reviewed with patient? Yes  SHORT TERM GOALS: Target date: 07-03-23 Pt will be I with initial HEP Baseline:no knowledge 06-19-23  Compliant with exercises at home Goal status: MET  2.  Pt pain in L LE to decrease by 25% Baseline: at worst 10/10  06-19-23 7/10 mostly at home relaxed 4-5/10 Goal status: MET  3.  Pt will be able to ambulate without CAM boot  Baseline: using short CAM boot at evaluation 06-19-23 Still weaning from CAM boot, unable to step without boot needing it  for balance 06/26/23: poor tolerance 7/10 pain Goal status:ONGOING.    LONG TERM GOALS: Target date: 07-31-23  Pt will be independent with advanced HEP Baseline: no knowledge Goal status: INITIAL  2.  Pt pain level negotiating steps will be 3/10 or less Baseline: unable to negotiate steps.  7-10/10 Goal status: INITIAL  3.  Pt will be able to perform household chores for at least 15 min without break Baseline: unable to perform chores cue to inability to stand greater than 1 minute Goal status: INITIAL  4.  Pt will be able to walk for 20 minutes to begin walking exercise program Baseline: unable to walk without CAM walking boot Goal status: INITIAL  5.  Pt will be able to shop without dependence on shopping scooter Baseline:  Goal status: INITIAL  6.  Pt will improve LEFS to at least 40/80 50% Baseline: 20/80 25 % eval Goal status: INITIAL   PLAN:  PT FREQUENCY: 1-2x/week  PT DURATION: 8 weeks  PLANNED INTERVENTIONS: [id number]- PT Re-evaluation, 97750- Physical Performance Testing, 97110-Therapeutic exercises, 97530- Therapeutic activity, [id number]- Neuromuscular re-education, 97535- Self Care, [id number]- Manual therapy, [id number]- Gait training, [phone]- Electrical stimulation (manual), Patient/Family education, Stair training, Taping, Dry Needling, Joint mobilization, Cryotherapy, and Moist heat  PLAN FOR NEXT SESSION: progress  exercise and AROM of L LE , Do 2 minute or 6 minute Walk test as tolerated  Sharlet Dawson, PT, ATRIC Certified Exercise Expert for the Aging Adult  06/28/23 12:50 PM Phone: [phone] Fax: [phone]

## 2023-06-28 ENCOUNTER — Ambulatory Visit: Admitting: Physical Therapy

## 2023-06-28 ENCOUNTER — Encounter: Payer: Self-pay | Admitting: Physical Therapy

## 2023-06-28 DIAGNOSIS — M25572 Pain in left ankle and joints of left foot: Secondary | ICD-10-CM

## 2023-06-28 DIAGNOSIS — R262 Difficulty in walking, not elsewhere classified: Secondary | ICD-10-CM

## 2023-06-28 DIAGNOSIS — M6281 Muscle weakness (generalized): Secondary | ICD-10-CM

## 2023-07-04 ENCOUNTER — Ambulatory Visit: Admitting: Physical Therapy

## 2023-07-04 ENCOUNTER — Encounter: Payer: Self-pay | Admitting: Physical Therapy

## 2023-07-04 DIAGNOSIS — M6281 Muscle weakness (generalized): Secondary | ICD-10-CM | POA: Diagnosis not present

## 2023-07-04 DIAGNOSIS — M25572 Pain in left ankle and joints of left foot: Secondary | ICD-10-CM

## 2023-07-04 DIAGNOSIS — R262 Difficulty in walking, not elsewhere classified: Secondary | ICD-10-CM

## 2023-07-04 NOTE — Therapy (Signed)
 OUTPATIENT PHYSICAL THERAPY LOWER EXTREMITY TREATMENT   Patient Name: Sara Erickson MRN: [id number] DOB:August 16, 1975, 48 y.o., female Today's Date: 07/04/2023  END OF SESSION:  PT End of Session - 07/04/23 0912     Visit Number 8    Number of Visits 16    Date for PT Re-Evaluation 07/31/23    Authorization Type [employer]    PT Start Time 0910    PT Stop Time 0948    PT Time Calculation (min) 38 min    Activity Tolerance Patient tolerated treatment well    Behavior During Therapy [facility] for tasks assessed/performed                Past Medical History:  Diagnosis Date   Anemia    Hypertension    Murmur 09/17/2014   Obesity (BMI 30.0-34.9) 09/17/2014   Past Surgical History:  Procedure Laterality Date   HYSTEROSCOPY WITH NOVASURE N/A 01/20/2022   Procedure: DIAGNOSTIC HYSTEROSCOPY WITH POLYPECTOMY;  NOVASURE ENDOMETRIAL ABLATION;  Surgeon: Ivery Marking, MD;  Location: [facility] [place];  Service: Gynecology;  Laterality: N/A;   LUMBAR DISC SURGERY  2017   Patient Active Problem List   Diagnosis Date Noted   IDA (iron deficiency anemia) 01/11/2022   Essential hypertension 09/17/2014   Murmur 09/17/2014   Obesity (BMI 30.0-34.9) 09/17/2014    PCP: Calton Catholic, PA-C   REFERRING PROVIDER: Floyce Hutching, DPM   REFERRING DIAG:  [phone] (ICD-10-CM) - Gastrocnemius equinus of left lower extremity  M72.2 (ICD-10-CM) - Plantar fasciitis, bilateral    THERAPY DIAG:  Muscle weakness (generalized)  Pain in left ankle and joints of left foot  Difficulty in walking, not elsewhere classified  Rationale for Evaluation and Treatment: Rehabilitation  ONSET DATE: 05-11-23 Surgery  SUBJECTIVE:   SUBJECTIVE STATEMENT: 06/28/23: 4/10 pain most of the time, especially with walking.  Pt is now back in CAM boot full time and will wait for MD to progress. Continuing to have pain at night with 7/10.   06/22/23: Pt reports no pain at rest. Pain occurs with  weight bearing. Saw MD who said to wear boot for longer distances, otherwise wear normal shoes.    Pt attended today's session with reports of 7/10 pain.  Last night I needed to take a pain pill and I feel like I am getting knots in the bottom of my foot. Pt stated that they have maintained fair compliance with current HEP.  Says that she can't do a lot of the exercises because it is too much and hurts a lot. She says she can't wait to get out of the boot. She is concerned about standing more than 15 minutes now to wash dishes with the boot now.   I had the Left EPF and endoscopic gastrocnemius recession  with PRP Surgery. 05-11-23, this Friday will be 4 weeks. I can't stand or walk for more than 1 minute.  I hurt so much. I have a boot to wear. I work and stand for long hours at [employer] and I cannot stand standing for even one minute. I can't shop without using a motorized cart.  PERTINENT HISTORY: Heart Murmur, HTN, obesity,  lumbar back surgery 2017 PAIN:  Are you having pain? Yes: NPRS scale: 0/10 at rest  and at worst 10/10 Pain location: Left on back/achilles tendon. Pain description: sharp Aggravating factors: can;t stand on foot for more than 1 minute, can't walk right now at all, Sleeping is good with medicine, can't do household Relieving  factors: Medication but don't like taking it Pt unable to stand in the shower PRECAUTIONS: None  RED FLAGS: None   WEIGHT BEARING RESTRICTIONS: Yes WBAT Left  FALLS:  Has patient fallen in last 6 months? No  LIVING ENVIRONMENT: Lives with: lives with their spouse and lives with their daughter Lives in: House/apartment Stairs: difficulty with stairs  now on 1st floor Has following equipment at home: Cam walking boot  OCCUPATION: work at [employer] standing most of time  PLOF: Independent  PATIENT GOALS: Get back to work at [employer], walking for exercise  NEXT MD VISIT: TBD  OBJECTIVE:  Note: Objective measures were completed at Evaluation  unless otherwise noted.  DIAGNOSTIC FINDINGS: See medical record  PATIENT SURVEYS:  LEFS 20/80  25%  COGNITION: Overall cognitive status: Within functional limits for tasks assessed     SENSATION: WFL  EDEMA:  NT  MUSCLE LENGTH: Hamstrings: Bilateral tightness   POSTURE: rounded shoulders, forward head, flexed trunk , and obese  PALPATION: TTP over achilles tendon and plantar fascia of L LE  LOWER EXTREMITY ROM:  Active ROM Right eval Left eval Left 06/19/23 Left 06-28-23  Hip flexion      Hip extension      Hip abduction      Hip adduction      Hip internal rotation      Hip external rotation      Knee flexion 120 120    Knee extension 0 0    Ankle dorsiflexion 11 0 10 10  Ankle plantarflexion 55 52  52  Ankle inversion 25 21  22   Ankle eversion 25 11  15    (Blank rows = not tested)  LOWER EXTREMITY MMT:  MMT Right eval Left eval  Hip flexion    Hip extension    Hip abduction    Hip adduction    Hip internal rotation    Hip external rotation    Knee flexion 115 115  Knee extension    Ankle dorsiflexion    Ankle plantarflexion 4/25 0/25  Ankle inversion    Ankle eversion     (Blank rows = not tested)  LOWER EXTREMITY SPECIAL TESTS:  NT due to surgery  FUNCTIONAL TESTS:  2 minute walk test: TBD 30 sec STS  6 x but heavy use of hands    SL stance  0 sec on Left,  SL stance 15 sec   GAIT: Distance walked: 150 with CAM walking boot in hand /wearing regular shoe Assistive device utilized: None Level of assistance: Modified independence Comments: using CAM short boot and antalgic gait left                                                                                                                                TREATMENT: [facility] Adult PT Treatment:  DATE: 07/04/2023 Therapeutic Exercise: Standing gentle gastroc stretch 3x30s Rec bike Lvl 3, 6' Neuromuscular re-ed: Split stance w/ 30% LLE WB  on balance disc 3x1', use manual pertubation to disc to create instability, UE a. PRN Transverse cross friction for pain blocking to distal L achilles tendon Therapeutic Activity: evaluative measures and POC discussion regarding current functional impairments     [facility] Adult PT Treatment:                                                DATE: 06-28-23 Remain in  CAM boot until seeing MD,  Surgery 05-11-23  Week 7 post op Therapeutic Exercise: Rec Bikel L1 x 5 minutes PROM of gastroc and soleus stretch prone RTB PF, IN, EV and DF with no pain  2 x 10 Seated heel raise with no pain Manual Therapy: STW to left calf in prone MTPR to left calf over tender areas  Therapeutic Activity: Heel lift in the Right Ambulation with CAM boot Modalities: HMP to left calf  [facility] Adult PT Treatment:                                                DATE: 06/26/23 Therapeutic Exercise: Passive gastroc and soleus stretch prone Rec Bikel L1 x 5 minutes Manual Therapy: STW to left calf prone  Modalities: concurrent with self care HMP to left calf   Self Care: Stretching, massage, use of tennis ball for self TPR to calf in long sitting        PATIENT EDUCATION:  Education details: POC Explanation of findings  issue of HEP Person educated: Patient Education method: Explanation, Demonstration, Tactile cues, Verbal cues, and Handouts Education comprehension: verbalized understanding, returned demonstration, verbal cues required, tactile cues required, and needs further education  HOME EXERCISE PROGRAM: Access Code: [email or web address] URL: [email or web address] Date: 06/05/2023 Prepared by: Sharlet Dawson  Program Notes TOe yoga,  great toe extension followed by 2-5 digit extension  Exercises - Seated Heel Raise  - 1 x daily - 7 x weekly - 1-2 sets - 10 reps - Seated Arch Lifts  - 1 x daily - 7 x weekly - 3 sets - 10 reps - Seated Ankle Inversion with Anchored Resistance  - 1 x daily - 7 x  weekly - 3 sets - 10 reps - Seated Ankle Eversion with Anchored Resistance  - 1 x daily - 7 x weekly - 3 sets - 10 reps - Seated Ankle Plantar Flexion with Resistance Loop  - 1 x daily - 7 x weekly - 3 sets - 10 reps - Seated Ankle Dorsiflexion with Resistance  - 1 x daily - 7 x weekly - 3 sets - 10 reps - Seated Heel Toe Raises  - 1 x daily - 7 x weekly - 3 sets - 10 reps Added 06-19-23  - Squat with Chair and Counter Support  - 1 x daily - 7 x weekly - 3 sets - 10 reps - sidelying Hip Abduction with ball under neck for support  - 1 x daily - 7 x weekly - 3 sets - 10 reps ASSESSMENT:  CLINICAL IMPRESSION: Pt attended physical therapy session for continuation of treatment regarding L EPF/ gastroc recession. Today's treatment focused  on improvement of  ankle stability, strength, and activity tolerance with standing. Time was also taken to clarify surgeon orders, current functional level, risk of re-injury and appropriateness for continuing OPPT. Pt reported being in 0/10 pain when siting still and only 4/10 pain when walking after taking all day Saturday to massage a knot out of her ankle that was causing most of her pain. Since then has had much less issue with flare ups and general pain levels. With this information alongside my evaluative measures coming up with no significant results, pt continues to be appropriate for OPPT and is currently in line with surgeons most recent orders for d/cing CAM boot as tolerated. Pt attended session with no CAM boot and no reports/signs of adverse effects. Pt showed  great tolerance to administered treatment with no adverse effects by the end of session. Skilled intervention was utilized via activity modification for pt tolerance with task completion, functional progression/regression promoting best outcomes inline with current rehab goals, as well as moderate verbal/tactile cuing alongside no physical assistance for safe and appropriate performance of today's  activities. Continue to progress as tolerated, with therapeutic focus on ankle stability and tolerance to standing/walking. F/u and modify HEP as necessary next visit    Pt reports she is 4/10 today after Manual and STW last session.  She has  been trying to get in touch MD for about 10 days.  Pt was placed in CAM boot full time by PT until clarified by MD. Pt has good motion in ankle but not good activity tolerance to standing especially without CAM boot and especially in regular shoes. Pt was able to perform exercises with red t band before STW  Pt also placed heel lift in R LE to lesson antalgic gait. She has normal DF.  Pt will progress as she is able to tolerate and after she talks with MD. Justine Oms plans to call again this Thursday afternoon before [facility].     06/22/23: Pt attended physical therapy session for continuation of treatment regarding Left EPF and endoscopic gastrocnemius recession . Today's treatment focused on improvement of  Left ankle/foot mobility and strength, WB tolerance, and intrinsic foot strength.Pt saw MD yesterday who discontinued boot except for long distances. Pt reports no pain at rest however has 6-7/10 pain with weight bearing whether in boot or not. She has antalgic gait pattern and poor tolerance to walking. Added single crutch to assist with pain and gait mechanics. Provided STW to lower calf with min improvement. Ice pack placed at lower calf at end of session to decrease pain.    Eval Impression:Patient is a 48 y.o. female who was seen today for physical therapy evaluation and treatment for Left EPF and endoscopic gastrocnemius recession  with PRP using short CAM boot. Pt with difficulty walking and 7/10 to 10/10 pain. Pt with impairment with AROM, weakness, difficulty walking and pain and inability to perform basic household chores due to pain.  Pt will benefit from skilled PT in order to return to work at [employer].  Surgery 05-11-23  OBJECTIVE IMPAIRMENTS:  decreased activity tolerance, decreased knowledge of condition, decreased mobility, difficulty walking, decreased ROM, decreased strength, increased fascial restrictions, postural dysfunction, obesity, and pain.   ACTIVITY LIMITATIONS: carrying, standing, squatting, stairs, transfers, and locomotion level  PARTICIPATION LIMITATIONS: meal prep, cleaning, laundry, shopping, community activity, and occupation  PERSONAL FACTORS: Heart Murmur, HTN, obesity,  lumbar back surgery 2017 are also affecting patient's functional outcome.   REHAB POTENTIAL: Good  CLINICAL DECISION MAKING: Evolving/moderate complexity  EVALUATION COMPLEXITY: Moderate   GOALS: Goals reviewed with patient? Yes  SHORT TERM GOALS: Target date: 07-03-23 Pt will be I with initial HEP Baseline:no knowledge 06-19-23  Compliant with exercises at home Goal status: MET  2.  Pt pain in L LE to decrease by 25% Baseline: at worst 10/10  06-19-23 7/10 mostly at home relaxed 4-5/10 Goal status: MET  3.  Pt will be able to ambulate without CAM boot  Baseline: using short CAM boot at evaluation 06-19-23 Still weaning from CAM boot, unable to step without boot needing it  for balance 06/26/23: poor tolerance 7/10 pain Goal status:ONGOING.    LONG TERM GOALS: Target date: 07-31-23  Pt will be independent with advanced HEP Baseline: no knowledge Goal status: INITIAL  2.  Pt pain level negotiating steps will be 3/10 or less Baseline: unable to negotiate steps.  7-10/10 Goal status: INITIAL  3.  Pt will be able to perform household chores for at least 15 min without break Baseline: unable to perform chores cue to inability to stand greater than 1 minute Goal status: INITIAL  4.  Pt will be able to walk for 20 minutes to begin walking exercise program Baseline: unable to walk without CAM walking boot Goal status: INITIAL  5.  Pt will be able to shop without dependence on shopping scooter Baseline:  Goal status:  INITIAL  6.  Pt will improve LEFS to at least 40/80 50% Baseline: 20/80 25 % eval Goal status: INITIAL   PLAN:  PT FREQUENCY: 1-2x/week  PT DURATION: 8 weeks  PLANNED INTERVENTIONS: [id number]- PT Re-evaluation, 97750- Physical Performance Testing, 97110-Therapeutic exercises, 97530- Therapeutic activity, [id number]- Neuromuscular re-education, 97535- Self Care, [id number]- Manual therapy, [id number]- Gait training, [phone]- Electrical stimulation (manual), Patient/Family education, Stair training, Taping, Dry Needling, Joint mobilization, Cryotherapy, and Moist heat  PLAN FOR NEXT SESSION: Continue to progress as tolerated, with therapeutic focus on ankle stability and tolerance to standing/walking. F/u and modify HEP as necessary next visit  Albesa Huguenin, PT, DPT 07/04/2023, 9:50 AM

## 2023-07-05 ENCOUNTER — Ambulatory Visit: Admitting: Physical Therapy

## 2023-07-10 ENCOUNTER — Encounter: Payer: Self-pay | Admitting: Physical Therapy

## 2023-07-10 ENCOUNTER — Ambulatory Visit: Attending: Podiatry | Admitting: Physical Therapy

## 2023-07-10 DIAGNOSIS — R262 Difficulty in walking, not elsewhere classified: Secondary | ICD-10-CM | POA: Insufficient documentation

## 2023-07-10 DIAGNOSIS — M25572 Pain in left ankle and joints of left foot: Secondary | ICD-10-CM | POA: Insufficient documentation

## 2023-07-10 DIAGNOSIS — M6281 Muscle weakness (generalized): Secondary | ICD-10-CM | POA: Diagnosis present

## 2023-07-10 NOTE — Therapy (Signed)
 OUTPATIENT PHYSICAL THERAPY TREATMENT & PROGRESS NOTE   Patient Name: Sara Erickson MRN: [id number] DOB:[birth year], 48 y.o., female Today's Date: 07/10/2023  END OF SESSION:  PT End of Session - 07/10/23 0911     Visit Number 9    Number of Visits 16    Date for PT Re-Evaluation 07/31/23    Authorization Type [employer]    PT Start Time 0915    PT Stop Time 0940    PT Time Calculation (min) 25 min    Activity Tolerance Patient tolerated treatment well    Behavior During Therapy [facility] for tasks assessed/performed                Past Medical History:  Diagnosis Date   Anemia    Hypertension    Murmur 09/17/2014   Obesity (BMI 30.0-34.9) 09/17/2014   Past Surgical History:  Procedure Laterality Date   HYSTEROSCOPY WITH NOVASURE N/A 01/20/2022   Procedure: DIAGNOSTIC HYSTEROSCOPY WITH POLYPECTOMY;  NOVASURE ENDOMETRIAL ABLATION;  Surgeon: Ivery Marking, MD;  Location: [facility];  Service: Gynecology;  Laterality: N/A;   LUMBAR DISC SURGERY  2017   Patient Active Problem List   Diagnosis Date Noted   IDA (iron deficiency anemia) 01/11/2022   Essential hypertension 09/17/2014   Murmur 09/17/2014   Obesity (BMI 30.0-34.9) 09/17/2014    PCP: Calton Catholic, PA-C   REFERRING PROVIDER: Floyce Hutching, DPM   REFERRING DIAG:  [id number] (ICD-10-CM) - Gastrocnemius equinus of left lower extremity  M72.2 (ICD-10-CM) - Plantar fasciitis, bilateral    THERAPY DIAG:  Pain in left ankle and joints of left foot  Muscle weakness (generalized)  Difficulty in walking, not elsewhere classified  Rationale for Evaluation and Treatment: Rehabilitation  ONSET DATE: 05-11-23 Surgery  SUBJECTIVE:   SUBJECTIVE STATEMENT: Pt attended today's session with reports of 1/10 pain only with walking. Pt stated that they have maintained great compliance with current HEP. Sates that she currently has no issues and is able to now stand in the shower and do everything  she wanted to be able to, prior to returning to work.  I had the Left EPF and endoscopic gastrocnemius recession  with PRP Surgery. 05-11-23, this Friday will be 4 weeks. I can't stand or walk for more than 1 minute.  I hurt so much. I have a boot to wear. I work and stand for long hours at [employer] and I cannot stand standing for even one minute. I can't shop without using a motorized cart.  PERTINENT HISTORY: Heart Murmur, HTN, obesity,  lumbar back surgery 2017 PAIN:  Are you having pain? Yes: NPRS scale: 0/10 at rest  and at worst 10/10 Pain location: Left on back/achilles tendon. Pain description: sharp Aggravating factors: can;t stand on foot for more than 1 minute, can't walk right now at all, Sleeping is good with medicine, can't do household Relieving factors: Medication but don't like taking it Pt unable to stand in the shower PRECAUTIONS: None  RED FLAGS: None   WEIGHT BEARING RESTRICTIONS: Yes WBAT Left  FALLS:  Has patient fallen in last 6 months? No  LIVING ENVIRONMENT: Lives with: lives with their spouse and lives with their daughter Lives in: House/apartment Stairs: difficulty with stairs  now on 1st floor Has following equipment at home: Cam walking boot  OCCUPATION: work at [employer] most of time  PLOF: Independent  PATIENT GOALS: Get back to work at [employer], walking for exercise  NEXT MD VISIT: TBD  OBJECTIVE:  Note: Objective measures were completed at Evaluation unless otherwise noted.  DIAGNOSTIC FINDINGS: See medical record  PATIENT SURVEYS:  LEFS 20/80  25% 07/10/2023: 78/80   COGNITION: Overall cognitive status: Within functional limits for tasks assessed     SENSATION: WFL  EDEMA:  NT  MUSCLE LENGTH: Hamstrings: Bilateral tightness   POSTURE: rounded shoulders, forward head, flexed trunk , and obese  PALPATION: TTP over achilles tendon and plantar fascia of L LE  LOWER EXTREMITY ROM:  Active ROM Right eval Left eval  Left 06/19/23 Left 06-28-23  Hip flexion      Hip extension      Hip abduction      Hip adduction      Hip internal rotation      Hip external rotation      Knee flexion 120 120    Knee extension 0 0    Ankle dorsiflexion 11 0 10 10  Ankle plantarflexion 55 52  52  Ankle inversion 25 21  22   Ankle eversion 25 11  15    (Blank rows = not tested)  LOWER EXTREMITY MMT:  MMT Right eval Left eval  Hip flexion    Hip extension    Hip abduction    Hip adduction    Hip internal rotation    Hip external rotation    Knee flexion 115 115  Knee extension    Ankle dorsiflexion    Ankle plantarflexion 4/25 0/25  Ankle inversion    Ankle eversion     (Blank rows = not tested)  LOWER EXTREMITY SPECIAL TESTS:  NT due to surgery  FUNCTIONAL TESTS:  2 minute walk test: TBD 30 sec STS  6 x but heavy use of hands    SL stance  0 sec on Left,  SL stance 15 sec   GAIT: Distance walked: 150 with CAM walking boot in hand /wearing regular shoe Assistive device utilized: None Level of assistance: Modified independence Comments: using CAM short boot and antalgic gait left                                                                                                                                TREATMENT:  [facility] Adult PT Treatment:                                                DATE: 07/10/2023 Therapeutic Activity: Re-evaluative measure Self Care: POC discussion Pt education   [facility] Adult PT Treatment:                                                DATE: 07/04/2023 Therapeutic Exercise: Standing gentle gastroc stretch 3x30s Rec bike  Lvl 3, 6' Neuromuscular re-ed: Split stance w/ 30% LLE WB on balance disc 3x1', use manual pertubation to disc to create instability, UE a. PRN Transverse cross friction for pain blocking to distal L achilles tendon Therapeutic Activity: evaluative measures and POC discussion regarding current functional impairments       PATIENT EDUCATION:   Education details: POC Explanation of findings  issue of HEP Person educated: Patient Education method: Explanation, Demonstration, Tactile cues, Verbal cues, and Handouts Education comprehension: verbalized understanding, returned demonstration, verbal cues required, tactile cues required, and needs further education  HOME EXERCISE PROGRAM: Access Code: [email or web address] URL: [email or web address] Date: 07/10/2023 Prepared by: Albesa Huguenin  Program Notes TOe yoga,  great toe extension followed by 2-5 digit extension  Exercises - Standing Hip Abduction with Counter Support  - 1 x daily - 7 x weekly - 3 sets - 10 reps - Gastroc stretch on wall,  Gentle Stretch   - 1 x daily - 7 x weekly - 2 sets - 1 reps - 55m hold - Heel raises with counter support Only Put as much weight as you can tolerate through L foot   - 1 x daily - 7 x weekly - 3 sets - 8-12 reps - 2s hold - Walking  - 1-3 x daily - 7 x weekly - 1 sets - 1 reps - 32m hold ASSESSMENT:  CLINICAL IMPRESSION: Pt attended physical therapy session for re-evaluation of L EPF/gastroc recession. At start of session, pt requested for a re-evaluation so that she can be d/c as she believes is she is good and wants to start going back to work. Pt has met all goals and continues to work towards no others. Pt required minimal v/t cuing as well as no physical assistance for safe and appropriate performance of today's activities. Wilt I believe pt would continue to benefit from skilled OPPT to progress towards higher level function. Pt was educated on rational and benefit for continuing treatment in clinic, pt verbalized competence and cintinued to request d/c because she is content with current functional level.  Education was given to continue applying ADL education from previous sessions as well as performing HEP as prescribed with freedom to progress as tolerated using previous education on modification and exercise dosage. Pt has displayed  and verbalized competence regarding this education.   Eval Impression:Patient is a 48 y.o. female who was seen today for physical therapy evaluation and treatment for Left EPF and endoscopic gastrocnemius recession  with PRP using short CAM boot. Pt with difficulty walking and 7/10 to 10/10 pain. Pt with impairment with AROM, weakness, difficulty walking and pain and inability to perform basic household chores due to pain.  Pt will benefit from skilled PT in order to return to work at [employer].  Surgery 05-11-23  OBJECTIVE IMPAIRMENTS: decreased activity tolerance, decreased knowledge of condition, decreased mobility, difficulty walking, decreased ROM, decreased strength, increased fascial restrictions, postural dysfunction, obesity, and pain.   ACTIVITY LIMITATIONS: carrying, standing, squatting, stairs, transfers, and locomotion level  PARTICIPATION LIMITATIONS: meal prep, cleaning, laundry, shopping, community activity, and occupation  PERSONAL FACTORS: Heart Murmur, HTN, obesity,  lumbar back surgery 2017 are also affecting patient's functional outcome.   REHAB POTENTIAL: Good  CLINICAL DECISION MAKING: Evolving/moderate complexity  EVALUATION COMPLEXITY: Moderate   GOALS: Goals reviewed with patient? Yes  SHORT TERM GOALS: Target date: 07-03-23 Pt will be I with initial HEP Baseline:no knowledge 06-19-23  Compliant with exercises at home Goal status: MET  2.  Pt pain  in L LE to decrease by 25% Baseline: at worst 10/10  06-19-23 7/10 mostly at home relaxed 4-5/10 Goal status: MET  3.  Pt will be able to ambulate without CAM boot  Baseline: using short CAM boot at evaluation 06-19-23 Still weaning from CAM boot, unable to step without boot needing it  for balance 06/26/23: poor tolerance 7/10 pain Goal status: MET 07/10/2023     LONG TERM GOALS: Target date: 07-31-23  Pt will be independent with advanced HEP Baseline: no knowledge Goal status: MET 07/10/2023  2.  Pt pain level  negotiating steps will be 3/10 or less Baseline: unable to negotiate steps.  7-10/10 Goal status: MET 07/10/2023 (ambulating 3 floor levels in complex with 1-2/10 pain)  3.  Pt will be able to perform household chores for at least 15 min without break Baseline: unable to perform chores cue to inability to stand greater than 1 minute Goal status: MET 07/10/2023  4.  Pt will be able to walk for 20 minutes to begin walking exercise program Baseline: unable to walk without CAM walking boot Goal status: MET 07/10/2023 (can walk as long as she wants with 1-2/10 pain)  5.  Pt will be able to shop without dependence on shopping scooter Baseline:  Goal status: MET 07/10/2023  6.  Pt will improve LEFS to at least 40/80 50% Baseline: 20/80 25 % eval 07/10/2023: 78/80 Goal status: MET 07/10/2023    PLAN:  PT FREQUENCY: 1-2x/week  PT DURATION: 8 weeks  PLANNED INTERVENTIONS: 97164- PT Re-evaluation, 97750- Physical Performance Testing, 97110-Therapeutic exercises, 97530- Therapeutic activity, 97112- Neuromuscular re-education, 97535- Self Care, 29562- Manual therapy, [phone]- Gait training, [phone]- Electrical stimulation (manual), Patient/Family education, Stair training, Taping, Dry Needling, Joint mobilization, Cryotherapy, and Moist heat  PLAN FOR NEXT SESSION: d/c   PHYSICAL THERAPY DISCHARGE SUMMARY  Visits from Start of Care: 9  Current functional level related to goals / functional outcomes: See assessment   Remaining deficits: See assessment   Education / Equipment: See assessment   Patient agrees to discharge. Patient goals were met. Patient is being discharged due to being pleased with the current functional level.   Albesa Huguenin, PT, DPT 07/10/2023, 9:41 AM

## 2023-07-12 ENCOUNTER — Ambulatory Visit: Admitting: Physical Therapy

## 2023-07-13 ENCOUNTER — Telehealth: Payer: Self-pay | Admitting: Podiatry

## 2023-07-13 NOTE — Telephone Encounter (Signed)
 Requesting a Release to work letter for the 6/11 working 4 hours until next appt on 6/26

## 2023-07-16 ENCOUNTER — Encounter: Payer: Self-pay | Admitting: Podiatry

## 2023-07-16 NOTE — Telephone Encounter (Signed)
 s/w pt she will pick up RTW note from office today.

## 2023-07-24 ENCOUNTER — Encounter: Payer: Self-pay | Admitting: Podiatry

## 2023-07-24 ENCOUNTER — Ambulatory Visit (INDEPENDENT_AMBULATORY_CARE_PROVIDER_SITE_OTHER)

## 2023-07-24 DIAGNOSIS — M722 Plantar fascial fibromatosis: Secondary | ICD-10-CM

## 2023-07-24 DIAGNOSIS — Z9889 Other specified postprocedural states: Secondary | ICD-10-CM

## 2023-07-24 DIAGNOSIS — M62462 Contracture of muscle, left lower leg: Secondary | ICD-10-CM

## 2023-07-25 NOTE — Progress Notes (Signed)
 She presents today with pain to her left leg and foot.  She has surgery by Dr. Michalene Agee May 11, 2023.  An endoscopic plantar fasciotomy and an endoscopic gastroc recession was performed.  She went back to work 4 hours a day but is only able to stand for about 2 hours without severe pain ensuing.  She states that she has pain in the arch and in the heel in the back of the calf and increases in intensity the longer she stands.  She would like a work note and possibly a surgical shoe to try to see if that would make any difference.  She states that she needs to be back at work and has an appointment with Dr. Michalene Agee on 6/26.  Objective: Vital signs are stable alert oriented x 3 all incision sites have healed pulses are strongly palpable.  She has tenderness on palpation of the surgical site in the medial longitudinal arch of her left foot.  Some tenderness on palpation of the gastrosoleus complex and of the incision sites of her left foot.  Assessment postop pain.  Plan: Wrote a note for her to continue out of work until she sees Dr. Michalene Agee next week she will also receive a [employer] shoe today.  She did not request any pain medication I did recommend contrast bath and explained that thoroughly.

## 2023-07-30 ENCOUNTER — Telehealth: Payer: Self-pay | Admitting: Podiatry

## 2023-07-30 MED ORDER — GABAPENTIN 300 MG PO CAPS: 300.0000 mg | ORAL_CAPSULE | Freq: Three times a day (TID) | ORAL | 0 refills | Status: DC

## 2023-07-30 NOTE — Addendum Note (Signed)
 Addended byBETHA MEDICINE, Tamana Hatfield R on: 07/30/2023 11:55 AM   Modules accepted: Orders

## 2023-07-30 NOTE — Telephone Encounter (Signed)
 Patient called asking for a refill of Gabapentin . Please send to [facility] in [place]

## 2023-08-02 ENCOUNTER — Encounter: Payer: Self-pay | Admitting: Podiatry

## 2023-08-02 ENCOUNTER — Ambulatory Visit: Admitting: Podiatry

## 2023-08-02 VITALS — Ht 69.0 in | Wt 221.0 lb

## 2023-08-02 DIAGNOSIS — M62462 Contracture of muscle, left lower leg: Secondary | ICD-10-CM

## 2023-08-02 DIAGNOSIS — M722 Plantar fascial fibromatosis: Secondary | ICD-10-CM

## 2023-08-02 MED ORDER — IBUPROFEN 800 MG PO TABS: 800.0000 mg | ORAL_TABLET | Freq: Three times a day (TID) | ORAL | 0 refills | Status: AC | PRN

## 2023-08-06 ENCOUNTER — Telehealth: Payer: Self-pay | Admitting: Podiatry

## 2023-08-06 DIAGNOSIS — M722 Plantar fascial fibromatosis: Secondary | ICD-10-CM

## 2023-08-06 DIAGNOSIS — M62462 Contracture of muscle, left lower leg: Secondary | ICD-10-CM

## 2023-08-06 NOTE — Telephone Encounter (Signed)
 Recd note from Dr. Silva. I emailed her notes/letter to [email or web address]

## 2023-08-06 NOTE — Progress Notes (Signed)
  Subjective:  Patient ID: ANALIZ TVEDT, female    DOB: October 12, 1975,  MRN: [id number]  Chief Complaint  Patient presents with   Routine Post Op    Pt is here to f/u on left foot due to pain surgery was in April and still having pain in the bottom and heel of her foot.    DOS: 05/11/2023 Procedure: Left EPF and endoscopic gastrocnemius recession  48 y.o. female returns for post-op check.  Still having some pain and difficulty in the plantar heel, she had gone back to work but was unable to do it  Review of Systems: Negative except as noted in the HPI. Denies N/V/F/Ch.   Objective:  There were no vitals filed for this visit. Body mass index is 32.64 kg/m. Constitutional Well developed. Well nourished.  Vascular Foot warm and well perfused. Capillary refill normal to all digits.  Calf is soft and supple, no posterior calf or knee pain, negative Homans' sign  Neurologic Normal speech. Oriented to person, place, and time. Epicritic sensation to light touch grossly present bilaterally.  Dermatologic incision is well-healed without hypertrophy  Orthopedic: Mild plantar heel pain to palpation noted about the surgical site.  4 out of 5 strength    Assessment:   1. Gastrocnemius equinus of left lower extremity   2. Plantar fasciitis, bilateral     Plan:  Patient was evaluated and treated and all questions answered.  S/p foot surgery left - Discussed with her most of her condition is deconditioning and needs to build strength of the calf muscle.  I recommended she restart physical therapy and she will call to schedule.  Ibuprofen  80 mg sent to pharmacy.  Likely went back to work too soon and will need more time.  Remain out of work until August 1.  Return prior to this to reevaluate.  Return in about 1 month (around 09/01/2023) for post op (no x-rays).

## 2023-08-06 NOTE — Telephone Encounter (Signed)
 Patient requests PT referral to [address]

## 2023-08-06 NOTE — Addendum Note (Signed)
 Addended byBETHA MEDICINE, Morenike Cuff R on: 08/06/2023 02:48 PM   Modules accepted: Orders

## 2023-08-07 ENCOUNTER — Telehealth: Payer: Self-pay | Admitting: Podiatry

## 2023-08-07 NOTE — Telephone Encounter (Signed)
 Pt left mess with [employer]'s fax#. I faxed her notes/letter to [employer] and emailed her to let her know I recd mess and faxed it.

## 2023-08-16 NOTE — Therapy (Signed)
 OUTPATIENT PHYSICAL THERAPY LOWER EXTREMITY EVALUATION   Patient Name: Sara Erickson MRN: [id number] DOB:[birth year], 48 y.o., female Today's Date: 08/21/2023  END OF SESSION:  PT End of Session - 08/21/23 1752     Visit Number 1    Number of Visits 16    Date for PT Re-Evaluation 10/16/23    Authorization Type Aet[employer]    PT Start Time 1102    PT Stop Time 1145    PT Time Calculation (min) 43 min    Activity Tolerance Patient tolerated treatment well    Behavior During Therapy [facility] for tasks assessed/performed          Past Medical History:  Diagnosis Date   Anemia    Hypertension    Murmur 09/17/2014   Obesity (BMI 30.0-34.9) 09/17/2014   Past Surgical History:  Procedure Laterality Date   HYSTEROSCOPY WITH NOVASURE N/A 01/20/2022   Procedure: DIAGNOSTIC HYSTEROSCOPY WITH POLYPECTOMY;  NOVASURE ENDOMETRIAL ABLATION;  Surgeon: Rutherford Gain, MD;  Location: [facility];  Service: Gynecology;  Laterality: N/A;   LUMBAR DISC SURGERY  2017   Patient Active Problem List   Diagnosis Date Noted   Prediabetes 09/04/2022   IDA (iron deficiency anemia) 01/11/2022   Essential hypertension 09/17/2014   Murmur 09/17/2014   Obesity (BMI 30.0-34.9) 09/17/2014    PCP: Emilio Joesph DEL, PA-C   REFERRING PROVIDER: Silva Juliene SAUNDERS, DPM   REFERRING DIAG:  M72.2 (ICD-10-CM) - Plantar fasciitis, bilateral  M62.462 (ICD-10-CM) - Gastrocnemius equinus of left lower extremity    THERAPY DIAG:  Pain in left ankle and joints of left foot - Plan: PT plan of care cert/re-cert  Muscle weakness (generalized) - Plan: PT plan of care cert/re-cert  Difficulty in walking, not elsewhere classified - Plan: PT plan of care cert/re-cert  Rationale for Evaluation and Treatment: Rehabilitation  ONSET DATE: 05-11-23 surgery and returned to work June 2025 for 4 hours a day  SUBJECTIVE:   SUBJECTIVE STATEMENT: Left EPF and endoscopic gastrocnemius recession with PRP  surgery.  Pt returned today and tried to work at [employer] on concrete floors 4 hours a day but could not tolerate. Pt returns to therapy today.  PERTINENT HISTORY: Heart Murmur, HTN, obesity, lumbar back surgery 2017  PAIN:  Are you having pain? Yes: NPRS scale: at rest 0/10 sitting but at worst at night 8/10  Pain location: Left foot plantar fascia, and mid Achilles Tendon on Left Pain description: during the day fine with shoes. At night stiff and tight,  Aggravating factors: Standing for longer than 5 minutes. Can't stand in the shower, Getting to sleep is difficulty due stiffness. Unable to work at [employer]. Relieving factors: Gabapentin  and Ibuprofen   PRECAUTIONS: None  RED FLAGS: None   WEIGHT BEARING RESTRICTIONS: WBAT L from surgery  Now in regular shoes and no restrictions  FALLS:  Has patient fallen in last 6 months? No  LIVING ENVIRONMENT: Lives with: lives with their spouse and lives with their daughter Lives in: House/apartment Stairs: stairs in home and difficulty ascending/descending Has following equipment at home: None  OCCUPATION: work at [employer] standing most of time   PLOF: Independent  PATIENT GOALS: Get back to work at [employer] and be able to stand for 8 hours and at least  4 hours to start  NEXT MD VISIT: 09-03-23 Dr Silva  possible cortisone injection  OBJECTIVE:  Note: Objective measures were completed at Evaluation unless otherwise noted.  DIAGNOSTIC FINDINGS: See medical record  PATIENT SURVEYS:  LEFS  Extreme difficulty/unable (0), Quite a bit of difficulty (1), Moderate difficulty (2), Little difficulty (3), No difficulty (4) Survey date:   08-21-23  Any of your usual work, housework or school activities       1  2. Usual hobbies, recreational or sporting activities        1  3. Getting into/out of the bath        1  4. Walking between rooms        2  5. Putting on socks/shoes        2  6. Squatting          3  7. Lifting an object, like a bag  of groceries from the floor         4  8. Performing light activities around your home         1  9. Performing heavy activities around your home         1  10. Getting into/out of a car          4  11. Walking 2 blocks          0  12. Walking 1 mile         0  13. Going up/down 10 stairs (1 flight)         3  14. Standing for 1 hour         2  15.  sitting for 1 hour        4  16. Running on even ground        0  17. Running on uneven ground         0  18. Making sharp turns while running fast         0  19. Hopping          0  20. Rolling over in bed          3  Score total:     32/80     COGNITION: Overall cognitive status: Within functional limits for tasks assessed     SENSATION: WFL Sharp pain developed over mid Achilles tendon  EDEMA:  NT  MUSCLE LENGTH: Hamstrings: Right bil tightness   POSTURE: rounded shoulders, forward head, flexed trunk , and obesity  PALPATION: TTP over mid achilles tendon and mid foot plantar fascia of L LE  LOWER EXTREMITY ROM:  Active ROM Right eval Left eval  Hip flexion    Hip extension    Hip abduction    Hip adduction    Hip internal rotation    Hip external rotation    Knee flexion 120 120  Knee extension 0 0  Ankle dorsiflexion 12 12  Ankle plantarflexion 55 40  Ankle inversion 32 29  Ankle eversion 25 22  Key: WFL = within functional limits not formally assessed, * = concordant pain, s = stiffness/stretching sensation, NT = not tested)    LOWER EXTREMITY MMT:  MMT Right eval Left eval  Hip flexion    Hip extension    Hip abduction    Hip adduction    Hip internal rotation    Hip external rotation    Knee flexion    Knee extension    Ankle dorsiflexion    Ankle plantarflexion 21/25* 0/25  Ankle inversion    Ankle eversion    (Blank rows = not tested, score listed is out of 5 possible points.  N = WNL, D = diminished, C =  clear for gross weakness with myotome testing, * = concordant pain with testing)   (Blank rows = not tested)   FUNCTIONAL TESTS:  5 times sit to stand: 10.40 sec 6 minute walk test: 734.5 ft with two breaks due to pain  Squat must bring  bil  heels /arches off floor and use UE strength  SL stance 0 sec on Left  SL stand on Right 19 sec GAIT: Distance walked:   734.5 ft  with 2 breaks Assistive device utilized: None Level of assistance: Complete Independence Comments: Pt with antalgic gait left and needing breaks due to pain while standing                                                                                                                                TREATMENT DATE: EVAL      PATIENT EDUCATION:  Education details: POC Explanation of findings  Issue new HEP Person educated: Patient Education method: Explanation, Demonstration, Tactile cues, Verbal cues, and Handouts Education comprehension: verbalized understanding, returned demonstration, verbal cues required, tactile cues required, and needs further education  HOME EXERCISE PROGRAM:  Access Code: [email or web address] Previous Access Code: [email or web address]  URL: [email or web address] Date: 08/21/2023 Prepared by: Graydon Dingwall  Exercises - seated heel raise with weights  - 1 x daily - 7 x weekly - 5 sets - 10 reps - Seated Ankle Inversion with Anchored Resistance  - 1 x daily - 7 x weekly - 3 sets - 10 reps - Seated Ankle Eversion with Anchored Resistance  - 1 x daily - 7 x weekly - 3 sets - 10 reps - Seated Ankle Plantar Flexion with Resistance Loop  - 1 x daily - 7 x weekly - 3 sets - 10 reps - Seated Ankle Dorsiflexion with Resistance  - 1 x daily - 7 x weekly - 3 sets - 10 reps  ASSESSMENT:  CLINICAL IMPRESSION: Patient is a 48 y.o. female who was seen today for physical therapy evaluation and treatment for post surgery for Left EPF and endoscopic gastrocnemius recession 05-11-23 by Dr Silva and  was discharged and is now returning due to not being able to tolerate standing at work  longer than 15 minutes. Pt with pain 5/10 to 10/10. LEFS 32/80. Pt with impairment in AROM, muscle weakness, difficulty with walking and inability to maintain single limb status on left LE or perform household chores.  Pt presently unable to work at [employer] in job which requires 5-8 hours of standing. Pt will benefit from skilled PT tin order to return and continue performing job at [employer]  OBJECTIVE IMPAIRMENTS: decreased activity tolerance, decreased knowledge of condition, decreased mobility, difficulty walking, decreased ROM, decreased strength, increased fascial restrictions, postural dysfunction, obesity, and pain.   ACTIVITY LIMITATIONS: carrying, standing, squatting, sleeping, stairs, transfers, and locomotion level  PARTICIPATION LIMITATIONS: meal prep, cleaning, laundry, shopping, community activity, and occupation  PERSONAL FACTORS: Heart Murmur, HTN, obesity, lumbar back surgery  2017  are also affecting patient's functional outcome.   REHAB POTENTIAL: Good  CLINICAL DECISION MAKING: Evolving/moderate complexity  EVALUATION COMPLEXITY: Moderate   GOALS: Goals reviewed with patient? Yes  SHORT TERM GOALS: Target date: 09-18-23 Pt will be I with initial HEP Baseline: Goal status: INITIAL  2.  Pain decreased in Left LE at night to sleep by 25 % Baseline: 8/10 at eval Goal status: INITIAL  3.  Pt will be able to ambulate without Antalgic gait Baseline: Pt with antalgic gait in normal shoes, 734.5 ft Goal status: INITIAL  4.  Pt will be able to tolerate 5 hours of work at [employer] to be able ot return Baseline:  Goal status: INITIAL  LONG TERM GOALS: Target date: 10-16-23  Pt will be independent with advanced HEP.  Baseline: needs reinforcement Goal status: INITIAL  2.   Pt will be able to negotiate steps without exacerbation of pain greater than 3/10 Baseline: Pt unable to negotiate steps 10/10 Goal status: INITIAL  3.  Pt will be able to perform household chores  for at least 20 min without break  Baseline: Pt unable to stand for 15 minutes or stand in the shower for greater than 5 minutes Goal status: INITIAL  4.  Pt would be able to stand for 30 minutes without a break in order to return to modified work standiing Baseline: unable to stand greater than 15 minutes Goal status: INITIAL  5.  Pt will improve LEFS score by at least 12 pts to show improved functional mobility Baseline: 32/80 Goal status: INITIAL  6.  Pt will be able to perform standing SL heel raise on LT  15/25 x Baseline: LT 0/25 Goal status: INITIAL   PLAN:  PT FREQUENCY: 1-2x/week  PT DURATION: 8 weeks  PLANNED INTERVENTIONS: 97164- PT Re-evaluation, 97750- Physical Performance Testing, 97110-Therapeutic exercises, 97530- Therapeutic activity, [id number]- Neuromuscular re-education, 97535- Self Care, 02859- Manual therapy, [id number]- Gait training, [phone]- Electrical stimulation (manual), and 20560 (1-2 muscles), 20561 (3+ muscles)- Dry Needling  PLAN FOR NEXT SESSION: manual  progress loading for left achilles tendonitis and post surgical pain   Graydon Dingwall, PT, ATRIC Certified Exercise Expert for the Aging Adult  08/21/23 9:35 PM Phone: [phone] Fax: [phone]

## 2023-08-21 ENCOUNTER — Ambulatory Visit: Attending: Podiatry | Admitting: Physical Therapy

## 2023-08-21 DIAGNOSIS — M6281 Muscle weakness (generalized): Secondary | ICD-10-CM | POA: Diagnosis present

## 2023-08-21 DIAGNOSIS — R262 Difficulty in walking, not elsewhere classified: Secondary | ICD-10-CM | POA: Diagnosis present

## 2023-08-21 DIAGNOSIS — M722 Plantar fascial fibromatosis: Secondary | ICD-10-CM | POA: Insufficient documentation

## 2023-08-21 DIAGNOSIS — M25572 Pain in left ankle and joints of left foot: Secondary | ICD-10-CM | POA: Diagnosis present

## 2023-08-21 DIAGNOSIS — M62462 Contracture of muscle, left lower leg: Secondary | ICD-10-CM | POA: Diagnosis not present

## 2023-08-28 ENCOUNTER — Ambulatory Visit: Admitting: Physical Therapy

## 2023-08-28 ENCOUNTER — Encounter: Payer: Self-pay | Admitting: Physical Therapy

## 2023-08-28 DIAGNOSIS — M6281 Muscle weakness (generalized): Secondary | ICD-10-CM

## 2023-08-28 DIAGNOSIS — M25572 Pain in left ankle and joints of left foot: Secondary | ICD-10-CM

## 2023-08-28 DIAGNOSIS — R262 Difficulty in walking, not elsewhere classified: Secondary | ICD-10-CM

## 2023-08-28 NOTE — Therapy (Unsigned)
 OUTPATIENT PHYSICAL THERAPY DAILY NOTE   Patient Name: Sara Erickson MRN: [id number] DOB:1975/12/15, 48 y.o., female Today's Date: 08/29/2023  END OF SESSION:  PT End of Session - 08/28/23 1554     Visit Number 2    Number of Visits 16    Date for PT Re-Evaluation 10/16/23    Authorization Type Aetna    PT Start Time 1612    PT Stop Time 1655    PT Time Calculation (min) 43 min    Activity Tolerance Patient tolerated treatment well    Behavior During Therapy [facility] for tasks assessed/performed          Past Medical History:  Diagnosis Date   Anemia    Hypertension    Murmur 09/17/2014   Obesity (BMI 30.0-34.9) 09/17/2014   Past Surgical History:  Procedure Laterality Date   HYSTEROSCOPY WITH NOVASURE N/A 01/20/2022   Procedure: DIAGNOSTIC HYSTEROSCOPY WITH POLYPECTOMY;  NOVASURE ENDOMETRIAL ABLATION;  Surgeon: Rutherford Gain, MD;  Location: [facility];  Service: Gynecology;  Laterality: N/A;   LUMBAR DISC SURGERY  2017   Patient Active Problem List   Diagnosis Date Noted   Prediabetes 09/04/2022   IDA (iron deficiency anemia) 01/11/2022   Essential hypertension 09/17/2014   Murmur 09/17/2014   Obesity (BMI 30.0-34.9) 09/17/2014    PCP: Emilio Joesph DEL, PA-C   REFERRING PROVIDER: Silva Juliene SAUNDERS, DPM   REFERRING DIAG:  M72.2 (ICD-10-CM) - Plantar fasciitis, bilateral  M62.462 (ICD-10-CM) - Gastrocnemius equinus of left lower extremity    THERAPY DIAG:  Pain in left ankle and joints of left foot  Muscle weakness (generalized)  Difficulty in walking, not elsewhere classified  Rationale for Evaluation and Treatment: Rehabilitation  ONSET DATE: 05-11-23 surgery and returned to work June 2025 for 4 hours a day  SUBJECTIVE:   SUBJECTIVE STATEMENT: Pt reports that she has the most pain at night.  She has no pain now.  PERTINENT HISTORY: Heart Murmur, HTN, obesity, lumbar back surgery 2017  PAIN:  Are you having pain? Yes: NPRS  scale: at rest 0/10 sitting but at worst at night 8/10  Pain location: Left foot plantar fascia, and mid Achilles Tendon on Left Pain description: during the day fine with shoes. At night stiff and tight,  Aggravating factors: Standing for longer than 5 minutes. Can't stand in the shower, Getting to sleep is difficulty due stiffness. Unable to work at [employer]. Relieving factors: Gabapentin  and Ibuprofen   PRECAUTIONS: None  RED FLAGS: None   WEIGHT BEARING RESTRICTIONS: WBAT L from surgery  Now in regular shoes and no restrictions  FALLS:  Has patient fallen in last 6 months? No  LIVING ENVIRONMENT: Lives with: lives with their spouse and lives with their daughter Lives in: House/apartment Stairs: stairs in home and difficulty ascending/descending Has following equipment at home: None  OCCUPATION: work at [employer] standing most of time   PLOF: Independent  PATIENT GOALS: Get back to work at [employer] and be able to stand for 8 hours and at least  4 hours to start  NEXT MD VISIT: 09-03-23 Dr Silva  possible cortisone injection  OBJECTIVE:  Note: Objective measures were completed at Evaluation unless otherwise noted.  DIAGNOSTIC FINDINGS: See medical record  PATIENT SURVEYS:  LEFS  Extreme difficulty/unable (0), Quite a bit of difficulty (1), Moderate difficulty (2), Little difficulty (3), No difficulty (4) Survey date:   08-21-23  Any of your usual work, housework or school activities       1  2. Usual hobbies, recreational or sporting activities        1  3. Getting into/out of the bath        1  4. Walking between rooms        2  5. Putting on socks/shoes        2  6. Squatting          3  7. Lifting an object, like a bag of groceries from the floor         4  8. Performing light activities around your home         1  9. Performing heavy activities around your home         1  10. Getting into/out of a car          4  11. Walking 2 blocks          0  12. Walking 1 mile          0  13. Going up/down 10 stairs (1 flight)         3  14. Standing for 1 hour         2  15.  sitting for 1 hour        4  16. Running on even ground        0  17. Running on uneven ground         0  18. Making sharp turns while running fast         0  19. Hopping          0  20. Rolling over in bed          3  Score total:     32/80     COGNITION: Overall cognitive status: Within functional limits for tasks assessed     SENSATION: WFL Sharp pain developed over mid Achilles tendon  EDEMA:  NT  MUSCLE LENGTH: Hamstrings: Right bil tightness   POSTURE: rounded shoulders, forward head, flexed trunk , and obesity  PALPATION: TTP over mid achilles tendon and mid foot plantar fascia of L LE  LOWER EXTREMITY ROM:  Active ROM Right eval Left eval  Hip flexion    Hip extension    Hip abduction    Hip adduction    Hip internal rotation    Hip external rotation    Knee flexion 120 120  Knee extension 0 0  Ankle dorsiflexion 12 12  Ankle plantarflexion 55 40  Ankle inversion 32 29  Ankle eversion 25 22  Key: WFL = within functional limits not formally assessed, * = concordant pain, s = stiffness/stretching sensation, NT = not tested)    LOWER EXTREMITY MMT:  MMT Right eval Left eval  Hip flexion    Hip extension    Hip abduction    Hip adduction    Hip internal rotation    Hip external rotation    Knee flexion    Knee extension    Ankle dorsiflexion    Ankle plantarflexion 21/25* 0/25  Ankle inversion    Ankle eversion    (Blank rows = not tested, score listed is out of 5 possible points.  N = WNL, D = diminished, C = clear for gross weakness with myotome testing, * = concordant pain with testing)  (Blank rows = not tested)   FUNCTIONAL TESTS:  5 times sit to stand: 10.40 sec 6 minute walk test: 734.5 ft with two breaks due  to pain  Squat must bring  bil  heels /arches off floor and use UE strength  SL stance 0 sec on Left  SL stand on Right 19  sec GAIT: Distance walked:   734.5 ft  with 2 breaks Assistive device utilized: None Level of assistance: Complete Independence Comments: Pt with antalgic gait left and needing breaks due to pain while standing                                                                                                                                TREATMENT DATE:   [facility] Adult PT Treatment  08/28/2023:  Therapeutic Exercise:  Bike - 4' - with some pain G/S stretch  Bil heel raise Leg press - SL heel raise - 20# - pain SL seated heel raise - 20# Bil heel raise w/ L shift to tolerance x3  Therapeutic Activity  Extensive discussion on progressive loading and need to strengthen at an appropriate level     HOME EXERCISE PROGRAM:  Access Code: [email or web address] Previous Access Code: [email or web address]  URL: [email or web address] Date: 08/21/2023 Prepared by: Graydon Dingwall  Exercises - seated heel raise with weights  - 1 x daily - 7 x weekly - 5 sets - 10 reps - Seated Ankle Inversion with Anchored Resistance  - 1 x daily - 7 x weekly - 3 sets - 10 reps - Seated Ankle Eversion with Anchored Resistance  - 1 x daily - 7 x weekly - 3 sets - 10 reps - Seated Ankle Plantar Flexion with Resistance Loop  - 1 x daily - 7 x weekly - 3 sets - 10 reps - Seated Ankle Dorsiflexion with Resistance  - 1 x daily - 7 x weekly - 3 sets - 10 reps  ASSESSMENT:  CLINICAL IMPRESSION:  08/29/2023: Fair tolerance d/t pain.  Extensive discussion on progressive strengthening and adaptation to stress.  Pt is pain focused.  Discussed need to try loading at a tolerable level to see if she can adapt to this.  Pt is somewhat receptive and will try some isometric heel raises before next visit.  EVAL: Patient is a 48 y.o. female who was seen today for physical therapy evaluation and treatment for post surgery for Left EPF and endoscopic gastrocnemius recession 05-11-23 by Dr Silva and  was discharged and is now  returning due to not being able to tolerate standing at work longer than 15 minutes. Pt with pain 5/10 to 10/10. LEFS 32/80. Pt with impairment in AROM, muscle weakness, difficulty with walking and inability to maintain single limb status on left LE or perform household chores.  Pt presently unable to work at [employer] in job which requires 5-8 hours of standing. Pt will benefit from skilled PT tin order to return and continue performing job at [employer]  OBJECTIVE IMPAIRMENTS: decreased activity tolerance, decreased knowledge of condition, decreased mobility, difficulty walking, decreased ROM, decreased strength, increased fascial restrictions, postural dysfunction, obesity,  and pain.   ACTIVITY LIMITATIONS: carrying, standing, squatting, sleeping, stairs, transfers, and locomotion level  PARTICIPATION LIMITATIONS: meal prep, cleaning, laundry, shopping, community activity, and occupation  PERSONAL FACTORS: Heart Murmur, HTN, obesity, lumbar back surgery 2017  are also affecting patient's functional outcome.   REHAB POTENTIAL: Good  CLINICAL DECISION MAKING: Evolving/moderate complexity  EVALUATION COMPLEXITY: Moderate   GOALS: Goals reviewed with patient? Yes  SHORT TERM GOALS: Target date: 09-18-23 Pt will be I with initial HEP Baseline: Goal status: INITIAL  2.  Pain decreased in Left LE at night to sleep by 25 % Baseline: 8/10 at eval Goal status: INITIAL  3.  Pt will be able to ambulate without Antalgic gait Baseline: Pt with antalgic gait in normal shoes, 734.5 ft Goal status: INITIAL  4.  Pt will be able to tolerate 5 hours of work at [employer] to be able ot return Baseline:  Goal status: INITIAL  LONG TERM GOALS: Target date: 10-16-23  Pt will be independent with advanced HEP.  Baseline: needs reinforcement Goal status: INITIAL  2.   Pt will be able to negotiate steps without exacerbation of pain greater than 3/10 Baseline: Pt unable to negotiate steps 10/10 Goal  status: INITIAL  3.  Pt will be able to perform household chores for at least 20 min without break  Baseline: Pt unable to stand for 15 minutes or stand in the shower for greater than 5 minutes Goal status: INITIAL  4.  Pt would be able to stand for 30 minutes without a break in order to return to modified work standiing Baseline: unable to stand greater than 15 minutes Goal status: INITIAL  5.  Pt will improve LEFS score by at least 12 pts to show improved functional mobility Baseline: 32/80 Goal status: INITIAL  6.  Pt will be able to perform standing SL heel raise on LT  15/25 x Baseline: LT 0/25 Goal status: INITIAL   PLAN:  PT FREQUENCY: 1-2x/week  PT DURATION: 8 weeks  PLANNED INTERVENTIONS: 97164- PT Re-evaluation, 97750- Physical Performance Testing, 97110-Therapeutic exercises, 97530- Therapeutic activity, [id number]- Neuromuscular re-education, 97535- Self Care, 02859- Manual therapy, [id number]- Gait training, [phone]- Electrical stimulation (manual), and 20560 (1-2 muscles), 20561 (3+ muscles)- Dry Needling  PLAN FOR NEXT SESSION: manual  progress loading for left achilles tendonitis and post surgical pain   Helene BRAVO Kasandra Fehr PT 08/29/23 8:02 AM Phone: [phone] Fax: [phone]

## 2023-08-30 NOTE — Telephone Encounter (Signed)
 Error

## 2023-08-31 ENCOUNTER — Encounter: Payer: Self-pay | Admitting: Physical Therapy

## 2023-08-31 ENCOUNTER — Ambulatory Visit: Admitting: Physical Therapy

## 2023-08-31 DIAGNOSIS — M6281 Muscle weakness (generalized): Secondary | ICD-10-CM

## 2023-08-31 DIAGNOSIS — R262 Difficulty in walking, not elsewhere classified: Secondary | ICD-10-CM

## 2023-08-31 DIAGNOSIS — M25572 Pain in left ankle and joints of left foot: Secondary | ICD-10-CM | POA: Diagnosis not present

## 2023-08-31 NOTE — Therapy (Signed)
 OUTPATIENT PHYSICAL THERAPY DAILY NOTE   Patient Name: Sara Erickson MRN: [id number] DOB:09-Jan-1976, 48 y.o., female Today's Date: 08/31/2023  END OF SESSION:  PT End of Session - 08/31/23 0832     Visit Number 3    Number of Visits 16    Date for PT Re-Evaluation 10/16/23    Authorization Type [employer]    PT Start Time 0830    PT Stop Time 0911    PT Time Calculation (min) 41 min    Activity Tolerance Patient tolerated treatment well    Behavior During Therapy [facility] for tasks assessed/performed          Past Medical History:  Diagnosis Date   Anemia    Hypertension    Murmur 09/17/2014   Obesity (BMI 30.0-34.9) 09/17/2014   Past Surgical History:  Procedure Laterality Date   HYSTEROSCOPY WITH NOVASURE N/A 01/20/2022   Procedure: DIAGNOSTIC HYSTEROSCOPY WITH POLYPECTOMY;  NOVASURE ENDOMETRIAL ABLATION;  Surgeon: Rutherford Gain, MD;  Location: [facility];  Service: Gynecology;  Laterality: N/A;   LUMBAR DISC SURGERY  2017   Patient Active Problem List   Diagnosis Date Noted   Prediabetes 09/04/2022   IDA (iron deficiency anemia) 01/11/2022   Essential hypertension 09/17/2014   Murmur 09/17/2014   Obesity (BMI 30.0-34.9) 09/17/2014    PCP: Emilio Joesph DEL, PA-C   REFERRING PROVIDER: Silva Juliene SAUNDERS, DPM   REFERRING DIAG:  M72.2 (ICD-10-CM) - Plantar fasciitis, bilateral  M62.462 (ICD-10-CM) - Gastrocnemius equinus of left lower extremity    THERAPY DIAG:  Pain in left ankle and joints of left foot  Muscle weakness (generalized)  Difficulty in walking, not elsewhere classified  Rationale for Evaluation and Treatment: Rehabilitation  ONSET DATE: 05-11-23 surgery and returned to work June 2025 for 4 hours a day  SUBJECTIVE:   SUBJECTIVE STATEMENT: Pt reports that she has been HEP compliant.  She has some pain with PF isometrics which resolves soon after completing the exercises.    She has no pain currently but pain up to 8/10 at  night.  PERTINENT HISTORY: Heart Murmur, HTN, obesity, lumbar back surgery 2017  PAIN:  Are you having pain? Yes: NPRS scale: at rest 0/10 sitting but at worst at night 8/10  Pain location: Left foot plantar fascia, and mid Achilles Tendon on Left Pain description: during the day fine with shoes. At night stiff and tight,  Aggravating factors: Standing for longer than 5 minutes. Can't stand in the shower, Getting to sleep is difficulty due stiffness. Unable to work at [employer]. Relieving factors: Gabapentin  and Ibuprofen   PRECAUTIONS: None  RED FLAGS: None   WEIGHT BEARING RESTRICTIONS: WBAT L from surgery  Now in regular shoes and no restrictions  FALLS:  Has patient fallen in last 6 months? No  LIVING ENVIRONMENT: Lives with: lives with their spouse and lives with their daughter Lives in: House/apartment Stairs: stairs in home and difficulty ascending/descending Has following equipment at home: None  OCCUPATION: work at [employer] standing most of time   PLOF: Independent  PATIENT GOALS: Get back to work at [employer] and be able to stand for 8 hours and at least  4 hours to start  NEXT MD VISIT: 09-03-23 Dr Silva  possible cortisone injection  OBJECTIVE:  Note: Objective measures were completed at Evaluation unless otherwise noted.  DIAGNOSTIC FINDINGS: See medical record  PATIENT SURVEYS:  LEFS  Extreme difficulty/unable (0), Quite a bit of difficulty (1), Moderate difficulty (2), Little difficulty (3), No difficulty (4)  Survey date:   08-21-23  Any of your usual work, housework or school activities       1  2. Usual hobbies, recreational or sporting activities        1  3. Getting into/out of the bath        1  4. Walking between rooms        2  5. Putting on socks/shoes        2  6. Squatting          3  7. Lifting an object, like a bag of groceries from the floor         4  8. Performing light activities around your home         1  9. Performing heavy activities  around your home         1  10. Getting into/out of a car          4  11. Walking 2 blocks          0  12. Walking 1 mile         0  13. Going up/down 10 stairs (1 flight)         3  14. Standing for 1 hour         2  15.  sitting for 1 hour        4  16. Running on even ground        0  17. Running on uneven ground         0  18. Making sharp turns while running fast         0  19. Hopping          0  20. Rolling over in bed          3  Score total:     32/80     COGNITION: Overall cognitive status: Within functional limits for tasks assessed     SENSATION: WFL Sharp pain developed over mid Achilles tendon  EDEMA:  NT  MUSCLE LENGTH: Hamstrings: Right bil tightness   POSTURE: rounded shoulders, forward head, flexed trunk , and obesity  PALPATION: TTP over mid achilles tendon and mid foot plantar fascia of L LE  LOWER EXTREMITY ROM:  Active ROM Right eval Left eval  Hip flexion    Hip extension    Hip abduction    Hip adduction    Hip internal rotation    Hip external rotation    Knee flexion 120 120  Knee extension 0 0  Ankle dorsiflexion 12 12  Ankle plantarflexion 55 40  Ankle inversion 32 29  Ankle eversion 25 22  Key: WFL = within functional limits not formally assessed, * = concordant pain, s = stiffness/stretching sensation, NT = not tested)    LOWER EXTREMITY MMT:  MMT Right eval Left eval  Hip flexion    Hip extension    Hip abduction    Hip adduction    Hip internal rotation    Hip external rotation    Knee flexion    Knee extension    Ankle dorsiflexion    Ankle plantarflexion 21/25* 0/25  Ankle inversion    Ankle eversion    (Blank rows = not tested, score listed is out of 5 possible points.  N = WNL, D = diminished, C = clear for gross weakness with myotome testing, * = concordant pain with testing)  (Blank rows = not  tested)   FUNCTIONAL TESTS:  5 times sit to stand: 10.40 sec 6 minute walk test: 734.5 ft with two breaks due  to pain  Squat must bring  bil  heels /arches off floor and use UE strength  SL stance 0 sec on Left  SL stand on Right 19 sec GAIT: Distance walked:   734.5 ft  with 2 breaks Assistive device utilized: None Level of assistance: Complete Independence Comments: Pt with antalgic gait left and needing breaks due to pain while standing                                                                                                                                TREATMENT DATE:   [facility] Adult PT Treatment  08/31/2023:  Therapeutic Exercise:  nu-step L5 66m while taking subjective and planning session with patient - increased sxs Heel slide in sitting for DF - 3x5 Seated heel raise - with KB - 25# - 4x5 G/S stretch  Bil heel raise w/ L shift to tolerance x3 (not today)  Neuromuscular re-ed: Feet together on foam Semi tandem on foam (50%) - 3x10 Blue rocker board df/pf - challenging  Manual Therapy  STM L G/S complex, medial>lat      HOME EXERCISE PROGRAM:  Access Code: [email or web address] Previous Access Code: [email or web address]  URL: [email or web address] Date: 08/21/2023 Prepared by: Graydon Dingwall  Exercises - seated heel raise with weights  - 1 x daily - 7 x weekly - 5 sets - 10 reps - Seated Ankle Inversion with Anchored Resistance  - 1 x daily - 7 x weekly - 3 sets - 10 reps - Seated Ankle Eversion with Anchored Resistance  - 1 x daily - 7 x weekly - 3 sets - 10 reps - Seated Ankle Plantar Flexion with Resistance Loop  - 1 x daily - 7 x weekly - 3 sets - 10 reps - Seated Ankle Dorsiflexion with Resistance  - 1 x daily - 7 x weekly - 3 sets - 10 reps  ASSESSMENT:  CLINICAL IMPRESSION:  08/31/2023: Fair tolerance d/t pain.  Continued working on entry point into strengthening today with reinforcement of previous discussion regarding progressive loading.  Pt able to complete listed exercises with exception of nu-step which causes increased tightness in calf.  Encouraged  her to continue isometric heel raises as tolerated 1x/day and to use massager over area if this helps reduce feeling of tightness.  EVAL: Patient is a 48 y.o. female who was seen today for physical therapy evaluation and treatment for post surgery for Left EPF and endoscopic gastrocnemius recession 05-11-23 by Dr Silva and  was discharged and is now returning due to not being able to tolerate standing at work longer than 15 minutes. Pt with pain 5/10 to 10/10. LEFS 32/80. Pt with impairment in AROM, muscle weakness, difficulty with walking and inability to maintain single limb status on left LE or perform household chores.  Pt  presently unable to work at [employer] in job which requires 5-8 hours of standing. Pt will benefit from skilled PT tin order to return and continue performing job at [employer]  OBJECTIVE IMPAIRMENTS: decreased activity tolerance, decreased knowledge of condition, decreased mobility, difficulty walking, decreased ROM, decreased strength, increased fascial restrictions, postural dysfunction, obesity, and pain.   ACTIVITY LIMITATIONS: carrying, standing, squatting, sleeping, stairs, transfers, and locomotion level  PARTICIPATION LIMITATIONS: meal prep, cleaning, laundry, shopping, community activity, and occupation  PERSONAL FACTORS: Heart Murmur, HTN, obesity, lumbar back surgery 2017  are also affecting patient's functional outcome.   REHAB POTENTIAL: Good  CLINICAL DECISION MAKING: Evolving/moderate complexity  EVALUATION COMPLEXITY: Moderate   GOALS: Goals reviewed with patient? Yes  SHORT TERM GOALS: Target date: 09-18-23 Pt will be I with initial HEP Baseline: Goal status: INITIAL  2.  Pain decreased in Left LE at night to sleep by 25 % Baseline: 8/10 at eval Goal status: INITIAL  3.  Pt will be able to ambulate without Antalgic gait Baseline: Pt with antalgic gait in normal shoes, 734.5 ft Goal status: INITIAL  4.  Pt will be able to tolerate 5 hours of  work at [employer] to be able ot return Baseline:  Goal status: INITIAL  LONG TERM GOALS: Target date: 10-16-23  Pt will be independent with advanced HEP.  Baseline: needs reinforcement Goal status: INITIAL  2.   Pt will be able to negotiate steps without exacerbation of pain greater than 3/10 Baseline: Pt unable to negotiate steps 10/10 Goal status: INITIAL  3.  Pt will be able to perform household chores for at least 20 min without break  Baseline: Pt unable to stand for 15 minutes or stand in the shower for greater than 5 minutes Goal status: INITIAL  4.  Pt would be able to stand for 30 minutes without a break in order to return to modified work standiing Baseline: unable to stand greater than 15 minutes Goal status: INITIAL  5.  Pt will improve LEFS score by at least 12 pts to show improved functional mobility Baseline: 32/80 Goal status: INITIAL  6.  Pt will be able to perform standing SL heel raise on LT  15/25 x Baseline: LT 0/25 Goal status: INITIAL   PLAN:  PT FREQUENCY: 1-2x/week  PT DURATION: 8 weeks  PLANNED INTERVENTIONS: 97164- PT Re-evaluation, 97750- Physical Performance Testing, 97110-Therapeutic exercises, 97530- Therapeutic activity, [id number]- Neuromuscular re-education, 97535- Self Care, [id number]- Manual therapy, [id number]- Gait training, [phone]- Electrical stimulation (manual), and 20560 (1-2 muscles), 20561 (3+ muscles)- Dry Needling  PLAN FOR NEXT SESSION: manual  progress loading for left achilles tendonitis and post surgical pain   Helene BRAVO Esa Raden PT 08/31/23 9:28 AM Phone: [phone] Fax: [phone]

## 2023-09-03 ENCOUNTER — Encounter: Payer: Self-pay | Admitting: Podiatry

## 2023-09-03 ENCOUNTER — Ambulatory Visit (INDEPENDENT_AMBULATORY_CARE_PROVIDER_SITE_OTHER): Admitting: Podiatry

## 2023-09-03 VITALS — Ht 69.0 in | Wt 221.0 lb

## 2023-09-03 DIAGNOSIS — M62462 Contracture of muscle, left lower leg: Secondary | ICD-10-CM | POA: Diagnosis not present

## 2023-09-03 DIAGNOSIS — M722 Plantar fascial fibromatosis: Secondary | ICD-10-CM

## 2023-09-03 MED ORDER — GABAPENTIN 300 MG PO CAPS: 300.0000 mg | ORAL_CAPSULE | Freq: Every day | ORAL | 0 refills | Status: AC

## 2023-09-03 NOTE — Progress Notes (Signed)
  Subjective:  Patient ID: Sara Erickson, female    DOB: 1975-08-05,  MRN: [id number]  Chief Complaint  Patient presents with   Post-op Follow-up    RM 20 Patient is here for pain and discomfort  of the left heel and achilles after prolonged activity and at night. Patient is currently in physical therapy. Patient is here for injection for pain.    DOS: 05/11/2023 Procedure: Left EPF and endoscopic gastrocnemius recession  48 y.o. female returns for post-op check.  Back in therapy, seems to be improving most of the pain is not in the heel now but more in the back of the Achilles  Review of Systems: Negative except as noted in the HPI. Denies N/V/F/Ch.   Objective:  There were no vitals filed for this visit. Body mass index is 32.64 kg/m. Constitutional Well developed. Well nourished.  Vascular Foot warm and well perfused. Capillary refill normal to all digits.  Calf is soft and supple, no posterior calf or knee pain, negative Homans' sign  Neurologic Normal speech. Oriented to person, place, and time. Epicritic sensation to light touch grossly present bilaterally.  Dermatologic incision is well-healed without hypertrophy  Orthopedic: 4+ out of 5 strength.  Slight tenderness in distal Achilles midsubstance, none at the posterior insertion, the gastrocnemius recession site or in the plantar heel    Assessment:   1. Plantar fasciitis, bilateral   2. Gastrocnemius equinus of left lower extremity     Plan:  Patient was evaluated and treated and all questions answered.  S/p foot surgery left - Proving with therapy.  She is continuing her ibuprofen , I also refilled her gabapentin  for pain control.  She should continue physical therapy I think strengthening conditioning is her primary focus right now.  The pain on the plantar heel has resolved and I do not recommend injection therapy at this point.  Extend out of work for 1 more month.  Handicap parking placard form filled  out. Return in about 1 month (around 10/04/2023) for surgery f/up (no x-rays).

## 2023-09-04 ENCOUNTER — Ambulatory Visit: Admitting: Physical Therapy

## 2023-09-04 ENCOUNTER — Telehealth: Payer: Self-pay | Admitting: Podiatry

## 2023-09-04 ENCOUNTER — Encounter: Payer: Self-pay | Admitting: Physical Therapy

## 2023-09-04 DIAGNOSIS — M6281 Muscle weakness (generalized): Secondary | ICD-10-CM

## 2023-09-04 DIAGNOSIS — M25572 Pain in left ankle and joints of left foot: Secondary | ICD-10-CM

## 2023-09-04 DIAGNOSIS — R262 Difficulty in walking, not elsewhere classified: Secondary | ICD-10-CM

## 2023-09-04 NOTE — Therapy (Signed)
 OUTPATIENT PHYSICAL THERAPY DAILY NOTE   Patient Name: Sara Erickson MRN: [id number] DOB:[birth year], 48 y.o., female Today's Date: 09/04/2023  END OF SESSION:  PT End of Session - 09/04/23 0828     Visit Number 4    Number of Visits 16    Date for PT Re-Evaluation 10/16/23    Authorization Type [employer]    PT Start Time [phone]    PT Stop Time 0925    PT Time Calculation (min) 42 min    Activity Tolerance Patient tolerated treatment well    Behavior During Therapy [facility] for tasks assessed/performed           Past Medical History:  Diagnosis Date   Anemia    Hypertension    Murmur 09/17/2014   Obesity (BMI 30.0-34.9) 09/17/2014   Past Surgical History:  Procedure Laterality Date   HYSTEROSCOPY WITH NOVASURE N/A 01/20/2022   Procedure: DIAGNOSTIC HYSTEROSCOPY WITH POLYPECTOMY;  NOVASURE ENDOMETRIAL ABLATION;  Surgeon: Rutherford Gain, MD;  Location: [facility] [place];  Service: Gynecology;  Laterality: N/A;   LUMBAR DISC SURGERY  2017   Patient Active Problem List   Diagnosis Date Noted   Prediabetes 09/04/2022   IDA (iron deficiency anemia) 01/11/2022   Essential hypertension 09/17/2014   Murmur 09/17/2014   Obesity (BMI 30.0-34.9) 09/17/2014    PCP: Emilio Joesph DEL, PA-C   REFERRING PROVIDER: Silva Juliene SAUNDERS, DPM   REFERRING DIAG:  M72.2 (ICD-10-CM) - Plantar fasciitis, bilateral  M62.462 (ICD-10-CM) - Gastrocnemius equinus of left lower extremity    THERAPY DIAG:  Pain in left ankle and joints of left foot  Muscle weakness (generalized)  Difficulty in walking, not elsewhere classified  Rationale for Evaluation and Treatment: Rehabilitation  ONSET DATE: 05-11-23 surgery and returned to work June 2025 for 4 hours a day  SUBJECTIVE:   SUBJECTIVE STATEMENT: Pt reports no pain at beginning of session. Saw MD and he said she is almost there and did not need cortisone injection.  She has pain after doing standing heel raises up to 4-5/10.  Pt  does state she has 10/10 at night  PERTINENT HISTORY: Heart Murmur, HTN, obesity, lumbar back surgery 2017  PAIN:  Are you having pain? Yes: NPRS scale: at rest 0/10 sitting but at worst at night 8/10  Pain location: Left foot plantar fascia, and mid Achilles Tendon on Left Pain description: during the day fine with shoes. At night stiff and tight,  Aggravating factors: Standing for longer than 5 minutes. Can't stand in the shower, Getting to sleep is difficulty due stiffness. Unable to work at [employer]. Relieving factors: Gabapentin  and Ibuprofen   PRECAUTIONS: None  RED FLAGS: None   WEIGHT BEARING RESTRICTIONS: WBAT L from surgery  Now in regular shoes and no restrictions  FALLS:  Has patient fallen in last 6 months? No  LIVING ENVIRONMENT: Lives with: lives with their spouse and lives with their daughter Lives in: House/apartment Stairs: stairs in home and difficulty ascending/descending Has following equipment at home: None  OCCUPATION: work at [employer] standing most of time   PLOF: Independent  PATIENT GOALS: Get back to work at [employer] and be able to stand for 8 hours and at least  4 hours to start  NEXT MD VISIT: 09-03-23 Dr Silva  possible cortisone injection  OBJECTIVE:  Note: Objective measures were completed at Evaluation unless otherwise noted.  DIAGNOSTIC FINDINGS: See medical record  PATIENT SURVEYS:  LEFS  Extreme difficulty/unable (0), Quite a bit of difficulty (1), Moderate difficulty (  2), Little difficulty (3), No difficulty (4) Survey date:   08-21-23  Any of your usual work, housework or school activities       1  2. Usual hobbies, recreational or sporting activities        1  3. Getting into/out of the bath        1  4. Walking between rooms        2  5. Putting on socks/shoes        2  6. Squatting          3  7. Lifting an object, like a bag of groceries from the floor         4  8. Performing light activities around your home         1  9.  Performing heavy activities around your home         1  10. Getting into/out of a car          4  11. Walking 2 blocks          0  12. Walking 1 mile         0  13. Going up/down 10 stairs (1 flight)         3  14. Standing for 1 hour         2  15.  sitting for 1 hour        4  16. Running on even ground        0  17. Running on uneven ground         0  18. Making sharp turns while running fast         0  19. Hopping          0  20. Rolling over in bed          3  Score total:     32/80     COGNITION: Overall cognitive status: Within functional limits for tasks assessed     SENSATION: WFL Sharp pain developed over mid Achilles tendon  EDEMA:  NT  MUSCLE LENGTH: Hamstrings: Right bil tightness   POSTURE: rounded shoulders, forward head, flexed trunk , and obesity  PALPATION: TTP over mid achilles tendon and mid foot plantar fascia of L LE  LOWER EXTREMITY ROM:  Active ROM Right eval Left eval  Hip flexion    Hip extension    Hip abduction    Hip adduction    Hip internal rotation    Hip external rotation    Knee flexion 120 120  Knee extension 0 0  Ankle dorsiflexion 12 12  Ankle plantarflexion 55 40  Ankle inversion 32 29  Ankle eversion 25 22  Key: WFL = within functional limits not formally assessed, * = concordant pain, s = stiffness/stretching sensation, NT = not tested)    LOWER EXTREMITY MMT:  MMT Right eval Left eval  Hip flexion    Hip extension    Hip abduction    Hip adduction    Hip internal rotation    Hip external rotation    Knee flexion    Knee extension    Ankle dorsiflexion    Ankle plantarflexion 21/25* 0/25  Ankle inversion    Ankle eversion    (Blank rows = not tested, score listed is out of 5 possible points.  N = WNL, D = diminished, C = clear for gross weakness with myotome testing, * = concordant pain  with testing)  (Blank rows = not tested)   FUNCTIONAL TESTS:  5 times sit to stand: 10.40 sec 6 minute walk test:  734.5 ft with two breaks due to pain  Squat must bring  bil  heels /arches off floor and use UE strength  SL stance 0 sec on Left  SL stand on Right 19 sec GAIT: Distance walked:   734.5 ft  with 2 breaks Assistive device utilized: None Level of assistance: Complete Independence Comments: Pt with antalgic gait left and needing breaks due to pain while standing                                                                                                                                TREATMENT DATE:  [facility] Adult PT Treatment:                                                DATE: 09-04-23 Therapeutic Exercise: Heel slide in sitting for DF with slider - 3x10 Seated heel raise - with KB - 25# - 3 x 8 G/S stretch 45 sec each on left Bil heel raise w/ L shift to tolerance x4 with pain beginning Manual Therapy: STW L Gastroc/medial, medial>lat with cocoa butter and IASTYM Neuromuscular re-ed: Feet together on foam Semi tandem on foam 1 x 10 with UE as needed Rocker board df/pf seated Therapeutic Activity: 1181ft  Barefoot 1 x 10 STS with 25 # KB pain begins    [facility] Adult PT Treatment  08/31/2023:  Therapeutic Exercise:  nu-step L5 31m while taking subjective and planning session with patient - increased sxs Heel slide in sitting for DF - 3x5 Seated heel raise - with KB - 25# - 4x5 G/S stretch  Bil heel raise w/ L shift to tolerance x3 (not today)  Neuromuscular re-ed: Feet together on foam Semi tandem on foam (50%) - 3x10 Blue rocker board df/pf - challenging  Manual Therapy  STM L G/S complex, medial>lat      HOME EXERCISE PROGRAM:  Access Code: [email or web address] Access Code: [email or web address]  URL: [email or web address] Date: 08/21/2023 Prepared by: Graydon Dingwall  Exercises - seated heel raise with weights  - 1 x daily - 7 x weekly - 5 sets - 10 reps - Seated Ankle Inversion with Anchored Resistance  - 1 x daily - 7 x weekly - 3 sets - 10 reps -  Seated Ankle Eversion with Anchored Resistance  - 1 x daily - 7 x weekly - 3 sets - 10 reps - Seated Ankle Plantar Flexion with Resistance Loop  - 1 x daily - 7 x weekly - 3 sets - 10 reps - Seated Ankle Dorsiflexion with Resistance  - 1 x daily - 7 x weekly - 3 sets - 10 reps  ASSESSMENT:  CLINICAL  IMPRESSION:  09/04/2023:Pt enters clinic without pain and no antalgic gait. Pt has not been working and pain is markedly reduced. Pt does increase pain with any movement moving into dorsiflexion and pf.   Session concentrates on acceptance of load and pain increased after STS with KB and also heel raises without weight.  Pt able to complete all exercises given but pain to 4-5/10. Discussed pain may increase with load but do not want to increase 6/10 or more with loading.   Encouraged her to continue isometric heel raises as tolerated 1x/day and to use massager over area since this has been successful for her this week.  Pt 1142ft  improved. Pt STG achieved # 3 today with no antalgic gait  EVAL: Patient is a 48 y.o. female who was seen today for physical therapy evaluation and treatment for post surgery for Left EPF and endoscopic gastrocnemius recession 05-11-23 by Dr Silva and  was discharged and is now returning due to not being able to tolerate standing at work longer than 15 minutes. Pt with pain 5/10 to 10/10. LEFS 32/80. Pt with impairment in AROM, muscle weakness, difficulty with walking and inability to maintain single limb status on left LE or perform household chores.  Pt presently unable to work at [employer] in job which requires 5-8 hours of standing. Pt will benefit from skilled PT tin order to return and continue performing job at [employer]  OBJECTIVE IMPAIRMENTS: decreased activity tolerance, decreased knowledge of condition, decreased mobility, difficulty walking, decreased ROM, decreased strength, increased fascial restrictions, postural dysfunction, obesity, and pain.   ACTIVITY  LIMITATIONS: carrying, standing, squatting, sleeping, stairs, transfers, and locomotion level  PARTICIPATION LIMITATIONS: meal prep, cleaning, laundry, shopping, community activity, and occupation  PERSONAL FACTORS: Heart Murmur, HTN, obesity, lumbar back surgery 2017  are also affecting patient's functional outcome.   REHAB POTENTIAL: Good  CLINICAL DECISION MAKING: Evolving/moderate complexity  EVALUATION COMPLEXITY: Moderate   GOALS: Goals reviewed with patient? Yes  SHORT TERM GOALS: Target date: 09-18-23 Pt will be I with initial HEP Baseline: Goal status:ONGOING  2.  Pain decreased in Left LE at night to sleep by 25 % Baseline: 8/10 at eval 09-04-23  no pain yesterday and today but still tossing and turning at night Goal status:PROGRESSING  3.  Pt will be able to ambulate without Antalgic gait Baseline: Pt with antalgic gait in normal shoes, 734.5 ft 09-04-23 1137ft  Goal status: MET no limping  4.  Pt will be able to tolerate 5 hours of work at [employer] to be able ot return Baseline:  09-04-23  not working but returns 10-09-23 Goal status: PROGRESSING  LONG TERM GOALS: Target date: 10-16-23  Pt will be independent with advanced HEP.  Baseline: needs reinforcement Goal status: INITIAL  2.   Pt will be able to negotiate steps without exacerbation of pain greater than 3/10 Baseline: Pt unable to negotiate steps 10/10 Goal status: INITIAL  3.  Pt will be able to perform household chores for at least 20 min without break  Baseline: Pt unable to stand for 15 minutes or stand in the shower for greater than 5 minutes Goal status: INITIAL  4.  Pt would be able to stand for 30 minutes without a break in order to return to modified work standiing Baseline: unable to stand greater than 15 minutes Goal status: INITIAL  5.  Pt will improve LEFS score by at least 12 pts to show improved functional mobility Baseline: 32/80 Goal status: INITIAL  6.  Pt will be able to  perform standing SL heel raise on LT  15/25 x Baseline: LT 0/25 Goal status: INITIAL   PLAN:  PT FREQUENCY: 1-2x/week  PT DURATION: 8 weeks  PLANNED INTERVENTIONS: 97164- PT Re-evaluation, 97750- Physical Performance Testing, 97110-Therapeutic exercises, 97530- Therapeutic activity, [id number]- Neuromuscular re-education, 97535- Self Care, [id number]- Manual therapy, [id number]- Gait training, [phone]- Electrical stimulation (manual), and 20560 (1-2 muscles), 20561 (3+ muscles)- Dry Needling  PLAN FOR NEXT SESSION: manual  progress loading for left achilles tendonitis and post surgical pain   Graydon Dingwall, PT, ATRIC Certified Exercise Expert for the Aging Adult  09/04/23 9:35 AM Phone: [phone] Fax: [phone]

## 2023-09-04 NOTE — Patient Instructions (Signed)
 TENS UNIT  This is helpful for muscle pain and spasm.   Search and Purchase a TENS 7000 2nd edition at www.tenspros.com or www.amazon.com  (It should be less than $30)     TENS unit instructions:  Do not shower or bathe with the unit on Turn the unit off before removing electrodes or batteries If the electrodes lose stickiness add a drop of water to the electrodes after they are disconnected from the unit and place on plastic sheet. If you continued to have difficulty, call the TENS unit company to purchase more electrodes. Do not apply lotion on the skin area prior to use. Make sure the skin is clean and dry as this will help prolong the life of the electrodes. After use, always check skin for unusual red areas, rash or other skin difficulties. If there are any skin problems, does not apply electrodes to the same area. Never remove the electrodes from the unit by pulling the wires. Do not use the TENS unit or electrodes other than as directed. Do not change electrode placement without consulting your therapist or physician. Keep 2 fingers with between each electrode.  Graydon Dingwall, PT, ATRIC Certified Exercise Expert for the Aging Adult  09/04/23 8:57 AM Phone: [phone] Fax: [phone]

## 2023-09-04 NOTE — Telephone Encounter (Signed)
 See prev notes. Per Dr. Silva RTW extended 10/09/23. Faxed [employer] letter/note [phone]

## 2023-09-07 ENCOUNTER — Ambulatory Visit: Attending: Podiatry | Admitting: Physical Therapy

## 2023-09-07 ENCOUNTER — Encounter: Payer: Self-pay | Admitting: Physical Therapy

## 2023-09-07 DIAGNOSIS — M25572 Pain in left ankle and joints of left foot: Secondary | ICD-10-CM | POA: Insufficient documentation

## 2023-09-07 DIAGNOSIS — R262 Difficulty in walking, not elsewhere classified: Secondary | ICD-10-CM | POA: Insufficient documentation

## 2023-09-07 DIAGNOSIS — M6281 Muscle weakness (generalized): Secondary | ICD-10-CM | POA: Insufficient documentation

## 2023-09-07 NOTE — Therapy (Signed)
 OUTPATIENT PHYSICAL THERAPY DAILY NOTE   Patient Name: Sara Erickson MRN: [id number] DOB:02-21-1975, 48 y.o., female Today's Date: 09/07/2023  END OF SESSION:  PT End of Session - 09/07/23 0714     Visit Number 5    Number of Visits 16    Date for PT Re-Evaluation 10/16/23    Authorization Type [employer]    PT Start Time 0715    PT Stop Time 0756    PT Time Calculation (min) 41 min    Activity Tolerance Patient tolerated treatment well    Behavior During Therapy [facility] for tasks assessed/performed           Past Medical History:  Diagnosis Date   Anemia    Hypertension    Murmur 09/17/2014   Obesity (BMI 30.0-34.9) 09/17/2014   Past Surgical History:  Procedure Laterality Date   HYSTEROSCOPY WITH NOVASURE N/A 01/20/2022   Procedure: DIAGNOSTIC HYSTEROSCOPY WITH POLYPECTOMY;  NOVASURE ENDOMETRIAL ABLATION;  Surgeon: Rutherford Gain, MD;  Location: [facility];  Service: Gynecology;  Laterality: N/A;   LUMBAR DISC SURGERY  2017   Patient Active Problem List   Diagnosis Date Noted   Prediabetes 09/04/2022   IDA (iron deficiency anemia) 01/11/2022   Essential hypertension 09/17/2014   Murmur 09/17/2014   Obesity (BMI 30.0-34.9) 09/17/2014    PCP: Emilio Joesph DEL, PA-C   REFERRING PROVIDER: Silva Juliene SAUNDERS, DPM   REFERRING DIAG:  M72.2 (ICD-10-CM) - Plantar fasciitis, bilateral  M62.462 (ICD-10-CM) - Gastrocnemius equinus of left lower extremity    THERAPY DIAG:  Pain in left ankle and joints of left foot  Muscle weakness (generalized)  Difficulty in walking, not elsewhere classified  Rationale for Evaluation and Treatment: Rehabilitation  ONSET DATE: 05-11-23 surgery and returned to work June 2025 for 4 hours a day  SUBJECTIVE:   SUBJECTIVE STATEMENT: Pt reports that she is a little sore this morning.  Overall she is seeing some improvement.  PERTINENT HISTORY: Heart Murmur, HTN, obesity, lumbar back surgery 2017  PAIN:  Are you  having pain? Yes: NPRS scale: at rest 0/10 sitting but at worst at night 8/10  Pain location: Left foot plantar fascia, and mid Achilles Tendon on Left Pain description: during the day fine with shoes. At night stiff and tight,  Aggravating factors: Standing for longer than 5 minutes. Can't stand in the shower, Getting to sleep is difficulty due stiffness. Unable to work at [employer]. Relieving factors: Gabapentin  and Ibuprofen   PRECAUTIONS: None  RED FLAGS: None   WEIGHT BEARING RESTRICTIONS: WBAT L from surgery  Now in regular shoes and no restrictions  FALLS:  Has patient fallen in last 6 months? No  LIVING ENVIRONMENT: Lives with: lives with their spouse and lives with their daughter Lives in: House/apartment Stairs: stairs in home and difficulty ascending/descending Has following equipment at home: None  OCCUPATION: work at [employer] standing most of time   PLOF: Independent  PATIENT GOALS: Get back to work at [employer] and be able to stand for 8 hours and at least  4 hours to start  NEXT MD VISIT: 09-03-23 Dr Silva  possible cortisone injection  OBJECTIVE:  Note: Objective measures were completed at Evaluation unless otherwise noted.  DIAGNOSTIC FINDINGS: See medical record  PATIENT SURVEYS:  LEFS  Extreme difficulty/unable (0), Quite a bit of difficulty (1), Moderate difficulty (2), Little difficulty (3), No difficulty (4) Survey date:   08-21-23  Any of your usual work, housework or school activities  1  2. Usual hobbies, recreational or sporting activities        1  3. Getting into/out of the bath        1  4. Walking between rooms        2  5. Putting on socks/shoes        2  6. Squatting          3  7. Lifting an object, like a bag of groceries from the floor         4  8. Performing light activities around your home         1  9. Performing heavy activities around your home         1  10. Getting into/out of a car          4  11. Walking 2 blocks          0   12. Walking 1 mile         0  13. Going up/down 10 stairs (1 flight)         3  14. Standing for 1 hour         2  15.  sitting for 1 hour        4  16. Running on even ground        0  17. Running on uneven ground         0  18. Making sharp turns while running fast         0  19. Hopping          0  20. Rolling over in bed          3  Score total:     32/80     COGNITION: Overall cognitive status: Within functional limits for tasks assessed     SENSATION: WFL Sharp pain developed over mid Achilles tendon  EDEMA:  NT  MUSCLE LENGTH: Hamstrings: Right bil tightness   POSTURE: rounded shoulders, forward head, flexed trunk , and obesity  PALPATION: TTP over mid achilles tendon and mid foot plantar fascia of L LE  LOWER EXTREMITY ROM:  Active ROM Right eval Left eval  Hip flexion    Hip extension    Hip abduction    Hip adduction    Hip internal rotation    Hip external rotation    Knee flexion 120 120  Knee extension 0 0  Ankle dorsiflexion 12 12  Ankle plantarflexion 55 40  Ankle inversion 32 29  Ankle eversion 25 22  Key: WFL = within functional limits not formally assessed, * = concordant pain, s = stiffness/stretching sensation, NT = not tested)    LOWER EXTREMITY MMT:  MMT Right eval Left eval  Hip flexion    Hip extension    Hip abduction    Hip adduction    Hip internal rotation    Hip external rotation    Knee flexion    Knee extension    Ankle dorsiflexion    Ankle plantarflexion 21/25* 0/25  Ankle inversion    Ankle eversion    (Blank rows = not tested, score listed is out of 5 possible points.  N = WNL, D = diminished, C = clear for gross weakness with myotome testing, * = concordant pain with testing)  (Blank rows = not tested)   FUNCTIONAL TESTS:  5 times sit to stand: 10.40 sec 6 minute walk test: 734.5 ft with two  breaks due to pain  Squat must bring  bil  heels /arches off floor and use UE strength  SL stance 0 sec on Left   SL stand on Right 19 sec GAIT: Distance walked:   734.5 ft  with 2 breaks Assistive device utilized: None Level of assistance: Complete Independence Comments: Pt with antalgic gait left and needing breaks due to pain while standing                                                                                                                                TREATMENT DATE:  [facility] Adult PT Treatment:                                                DATE:  09/07/2023 Therapeutic Exercise: Heel slide in sitting for DF with slider - 3x10 Seated heel raise - with KB - 30# - 3x7 - 2'' up 2'' down G/S stretch 45 sec each on left Education regarding progressive loading  KT tape Manual Therapy: STW L Gastroc/medial, medial>lat Neuromuscular re-ed: Semi tandem on foam    HOME EXERCISE PROGRAM:  Access Code: [email or web address] Previous Access Code: [email or web address]  URL: [email or web address] Date: 08/21/2023 Prepared by: Graydon Dingwall  Exercises - seated heel raise with weights  - 1 x daily - 7 x weekly - 5 sets - 10 reps - Seated Ankle Inversion with Anchored Resistance  - 1 x daily - 7 x weekly - 3 sets - 10 reps - Seated Ankle Eversion with Anchored Resistance  - 1 x daily - 7 x weekly - 3 sets - 10 reps - Seated Ankle Plantar Flexion with Resistance Loop  - 1 x daily - 7 x weekly - 3 sets - 10 reps - Seated Ankle Dorsiflexion with Resistance  - 1 x daily - 7 x weekly - 3 sets - 10 reps  ASSESSMENT:  CLINICAL IMPRESSION:  09/07/2023:  Pt enters clinic with higher pain levels today.  She reports that she felt good after the last for a day but is having more issues this morning.  She states that she has been doing a bike for 3' at her gym and completing her exercises.  Reinforced benefits of progressive loading.  Introduced the idea that without appropriate loading we may not reach the threshold for tissue adaptation.  She states that she has some pain with loading exercises but this  pain is transient.  I encouraged her to complete these as long as the pain reduces quickly following; if she doesn't we won't know if this is an effective strategy.  Pt is somewhat receptive.  EVAL: Patient is a 48 y.o. female who was seen today for physical therapy evaluation and treatment for post surgery for Left EPF and endoscopic gastrocnemius recession 05-11-23 by Dr Silva and  was discharged and is now returning due to not being able to tolerate standing at work longer than 15 minutes. Pt with pain 5/10 to 10/10. LEFS 32/80. Pt with impairment in AROM, muscle weakness, difficulty with walking and inability to maintain single limb status on left LE or perform household chores.  Pt presently unable to work at [employer] in job which requires 5-8 hours of standing. Pt will benefit from skilled PT tin order to return and continue performing job at [employer]  OBJECTIVE IMPAIRMENTS: decreased activity tolerance, decreased knowledge of condition, decreased mobility, difficulty walking, decreased ROM, decreased strength, increased fascial restrictions, postural dysfunction, obesity, and pain.   ACTIVITY LIMITATIONS: carrying, standing, squatting, sleeping, stairs, transfers, and locomotion level  PARTICIPATION LIMITATIONS: meal prep, cleaning, laundry, shopping, community activity, and occupation  PERSONAL FACTORS: Heart Murmur, HTN, obesity, lumbar back surgery 2017  are also affecting patient's functional outcome.   REHAB POTENTIAL: Good  CLINICAL DECISION MAKING: Evolving/moderate complexity  EVALUATION COMPLEXITY: Moderate   GOALS: Goals reviewed with patient? Yes  SHORT TERM GOALS: Target date: 09-18-23 Pt will be I with initial HEP Baseline: Goal status:ONGOING  2.  Pain decreased in Left LE at night to sleep by 25 % Baseline: 8/10 at eval 09-04-23  no pain yesterday and today but still tossing and turning at night Goal status:PROGRESSING  3.  Pt will be able to ambulate without Antalgic  gait Baseline: Pt with antalgic gait in normal shoes, 734.5 ft 09-04-23 1179ft  Goal status: MET no limping  4.  Pt will be able to tolerate 5 hours of work at [employer] to be able ot return Baseline:  09-04-23  not working but returns 10-09-23 Goal status: PROGRESSING  LONG TERM GOALS: Target date: 10-16-23  Pt will be independent with advanced HEP.  Baseline: needs reinforcement Goal status: INITIAL  2.   Pt will be able to negotiate steps without exacerbation of pain greater than 3/10 Baseline: Pt unable to negotiate steps 10/10 Goal status: INITIAL  3.  Pt will be able to perform household chores for at least 20 min without break  Baseline: Pt unable to stand for 15 minutes or stand in the shower for greater than 5 minutes Goal status: INITIAL  4.  Pt would be able to stand for 30 minutes without a break in order to return to modified work standiing Baseline: unable to stand greater than 15 minutes Goal status: INITIAL  5.  Pt will improve LEFS score by at least 12 pts to show improved functional mobility Baseline: 32/80 Goal status: INITIAL  6.  Pt will be able to perform standing SL heel raise on LT  15/25 x Baseline: LT 0/25 Goal status: INITIAL   PLAN:  PT FREQUENCY: 1-2x/week  PT DURATION: 8 weeks  PLANNED INTERVENTIONS: 97164- PT Re-evaluation, 97750- Physical Performance Testing, 97110-Therapeutic exercises, 97530- Therapeutic activity, [id number]- Neuromuscular re-education, 97535- Self Care, 02859- Manual therapy, [id number]- Gait training, [phone]- Electrical stimulation (manual), and 20560 (1-2 muscles), 20561 (3+ muscles)- Dry Needling  PLAN FOR NEXT SESSION: manual  progress loading for left achilles tendonitis and post surgical pain   Helene BRAVO Torrence Hammack PT 09/07/23 8:22 AM Phone: [phone] Fax: [phone]

## 2023-09-10 ENCOUNTER — Telehealth: Payer: Self-pay | Admitting: Podiatry

## 2023-09-10 NOTE — Therapy (Incomplete)
 OUTPATIENT PHYSICAL THERAPY DAILY NOTE   Patient Name: Sara Erickson MRN: [id number] DOB:Mar 17, 1975, 48 y.o., female Today's Date: 09/10/2023  END OF SESSION:     Past Medical History:  Diagnosis Date   Anemia    Hypertension    Murmur 09/17/2014   Obesity (BMI 30.0-34.9) 09/17/2014   Past Surgical History:  Procedure Laterality Date   HYSTEROSCOPY WITH NOVASURE N/A 01/20/2022   Procedure: DIAGNOSTIC HYSTEROSCOPY WITH POLYPECTOMY;  NOVASURE ENDOMETRIAL ABLATION;  Surgeon: Rutherford Gain, MD;  Location: [facility];  Service: Gynecology;  Laterality: N/A;   LUMBAR DISC SURGERY  2017   Patient Active Problem List   Diagnosis Date Noted   Prediabetes 09/04/2022   IDA (iron deficiency anemia) 01/11/2022   Essential hypertension 09/17/2014   Murmur 09/17/2014   Obesity (BMI 30.0-34.9) 09/17/2014    PCP: Emilio Joesph DEL, PA-C   REFERRING PROVIDER: Silva Juliene SAUNDERS, DPM   REFERRING DIAG:  M72.2 (ICD-10-CM) - Plantar fasciitis, bilateral  M62.462 (ICD-10-CM) - Gastrocnemius equinus of left lower extremity    THERAPY DIAG:  No diagnosis found.  Rationale for Evaluation and Treatment: Rehabilitation  ONSET DATE: 05-11-23 surgery and returned to work June 2025 for 4 hours a day  SUBJECTIVE:   SUBJECTIVE STATEMENT: Pt reports that she is a little sore this morning.  Overall she is seeing some improvement.  PERTINENT HISTORY: Heart Murmur, HTN, obesity, lumbar back surgery 2017  PAIN:  Are you having pain? Yes: NPRS scale: at rest 0/10 sitting but at worst at night 8/10  Pain location: Left foot plantar fascia, and mid Achilles Tendon on Left Pain description: during the day fine with shoes. At night stiff and tight,  Aggravating factors: Standing for longer than 5 minutes. Can't stand in the shower, Getting to sleep is difficulty due stiffness. Unable to work at [employer]. Relieving factors: Gabapentin  and Ibuprofen   PRECAUTIONS: None  RED  FLAGS: None   WEIGHT BEARING RESTRICTIONS: WBAT L from surgery  Now in regular shoes and no restrictions  FALLS:  Has patient fallen in last 6 months? No  LIVING ENVIRONMENT: Lives with: lives with their spouse and lives with their daughter Lives in: House/apartment Stairs: stairs in home and difficulty ascending/descending Has following equipment at home: None  OCCUPATION: work at [employer] standing most of time   PLOF: Independent  PATIENT GOALS: Get back to work at [employer] and be able to stand for 8 hours and at least  4 hours to start  NEXT MD VISIT: 09-03-23 Dr Silva  possible cortisone injection  OBJECTIVE:  Note: Objective measures were completed at Evaluation unless otherwise noted.  DIAGNOSTIC FINDINGS: See medical record  PATIENT SURVEYS:  LEFS  Extreme difficulty/unable (0), Quite a bit of difficulty (1), Moderate difficulty (2), Little difficulty (3), No difficulty (4) Survey date:   08-21-23  Any of your usual work, housework or school activities       1  2. Usual hobbies, recreational or sporting activities        1  3. Getting into/out of the bath        1  4. Walking between rooms        2  5. Putting on socks/shoes        2  6. Squatting          3  7. Lifting an object, like a bag of groceries from the floor         4  8. Performing light activities around your  home         1  9. Performing heavy activities around your home         1  10. Getting into/out of a car          4  11. Walking 2 blocks          0  12. Walking 1 mile         0  13. Going up/down 10 stairs (1 flight)         3  14. Standing for 1 hour         2  15.  sitting for 1 hour        4  16. Running on even ground        0  17. Running on uneven ground         0  18. Making sharp turns while running fast         0  19. Hopping          0  20. Rolling over in bed          3  Score total:     32/80     COGNITION: Overall cognitive status: Within functional limits for tasks  assessed     SENSATION: WFL Sharp pain developed over mid Achilles tendon  EDEMA:  NT  MUSCLE LENGTH: Hamstrings: Right bil tightness   POSTURE: rounded shoulders, forward head, flexed trunk , and obesity  PALPATION: TTP over mid achilles tendon and mid foot plantar fascia of L LE  LOWER EXTREMITY ROM:  Active ROM Right eval Left eval  Hip flexion    Hip extension    Hip abduction    Hip adduction    Hip internal rotation    Hip external rotation    Knee flexion 120 120  Knee extension 0 0  Ankle dorsiflexion 12 12  Ankle plantarflexion 55 40  Ankle inversion 32 29  Ankle eversion 25 22  Key: WFL = within functional limits not formally assessed, * = concordant pain, s = stiffness/stretching sensation, NT = not tested)    LOWER EXTREMITY MMT:  MMT Right eval Left eval  Hip flexion    Hip extension    Hip abduction    Hip adduction    Hip internal rotation    Hip external rotation    Knee flexion    Knee extension    Ankle dorsiflexion    Ankle plantarflexion 21/25* 0/25  Ankle inversion    Ankle eversion    (Blank rows = not tested, score listed is out of 5 possible points.  N = WNL, D = diminished, C = clear for gross weakness with myotome testing, * = concordant pain with testing)  (Blank rows = not tested)   FUNCTIONAL TESTS:  5 times sit to stand: 10.40 sec 6 minute walk test: 734.5 ft with two breaks due to pain  Squat must bring  bil  heels /arches off floor and use UE strength  SL stance 0 sec on Left  SL stand on Right 19 sec GAIT: Distance walked:   734.5 ft  with 2 breaks Assistive device utilized: None Level of assistance: Complete Independence Comments: Pt with antalgic gait left and needing breaks due to pain while standing  TREATMENT DATE:  [facility] Adult PT Treatment:                                                 DATE: *** Therapeutic Exercise: *** Manual Therapy: *** Neuromuscular re-ed: *** Therapeutic Activity *** Modalities: *** Self Care: ***   [facility] Adult PT Treatment:                                                DATE:  09/10/2023 Therapeutic Exercise: Heel slide in sitting for DF with slider - 3x10 Seated heel raise - with KB - 30# - 3x7 - 2'' up 2'' down G/S stretch 45 sec each on left Education regarding progressive loading  KT tape Manual Therapy: STW L Gastroc/medial, medial>lat Neuromuscular re-ed: Semi tandem on foam    HOME EXERCISE PROGRAM:  Access Code: [email or web address] Previous Access Code: [email or web address]  URL: [email or web address] Date: 08/21/2023 Prepared by: Graydon Dingwall  Exercises - seated heel raise with weights  - 1 x daily - 7 x weekly - 5 sets - 10 reps - Seated Ankle Inversion with Anchored Resistance  - 1 x daily - 7 x weekly - 3 sets - 10 reps - Seated Ankle Eversion with Anchored Resistance  - 1 x daily - 7 x weekly - 3 sets - 10 reps - Seated Ankle Plantar Flexion with Resistance Loop  - 1 x daily - 7 x weekly - 3 sets - 10 reps - Seated Ankle Dorsiflexion with Resistance  - 1 x daily - 7 x weekly - 3 sets - 10 reps  ASSESSMENT:  CLINICAL IMPRESSION:  09/10/2023:  Pt enters clinic with higher pain levels today.  She reports that she felt good after the last for a day but is having more issues this morning.  She states that she has been doing a bike for 3' at her gym and completing her exercises.  Reinforced benefits of progressive loading.  Introduced the idea that without appropriate loading we may not reach the threshold for tissue adaptation.  She states that she has some pain with loading exercises but this pain is transient.  I encouraged her to complete these as long as the pain reduces quickly following; if she doesn't we won't know if this is an effective strategy.  Pt is somewhat receptive.  EVAL: Patient is a 49 y.o. female who  was seen today for physical therapy evaluation and treatment for post surgery for Left EPF and endoscopic gastrocnemius recession 05-11-23 by Dr Silva and  was discharged and is now returning due to not being able to tolerate standing at work longer than 15 minutes. Pt with pain 5/10 to 10/10. LEFS 32/80. Pt with impairment in AROM, muscle weakness, difficulty with walking and inability to maintain single limb status on left LE or perform household chores.  Pt presently unable to work at [employer] in job which requires 5-8 hours of standing. Pt will benefit from skilled PT tin order to return and continue performing job at [employer]  OBJECTIVE IMPAIRMENTS: decreased activity tolerance, decreased knowledge of condition, decreased mobility, difficulty walking, decreased ROM, decreased strength, increased fascial restrictions, postural dysfunction, obesity, and pain.   ACTIVITY LIMITATIONS: carrying, standing, squatting, sleeping, stairs, transfers, and locomotion  level  PARTICIPATION LIMITATIONS: meal prep, cleaning, laundry, shopping, community activity, and occupation  PERSONAL FACTORS: Heart Murmur, HTN, obesity, lumbar back surgery 2017  are also affecting patient's functional outcome.   REHAB POTENTIAL: Good  CLINICAL DECISION MAKING: Evolving/moderate complexity  EVALUATION COMPLEXITY: Moderate   GOALS: Goals reviewed with patient? Yes  SHORT TERM GOALS: Target date: 09-18-23 Pt will be I with initial HEP Baseline: Goal status:ONGOING  2.  Pain decreased in Left LE at night to sleep by 25 % Baseline: 8/10 at eval 09-04-23  no pain yesterday and today but still tossing and turning at night Goal status:PROGRESSING  3.  Pt will be able to ambulate without Antalgic gait Baseline: Pt with antalgic gait in normal shoes, 734.5 ft 09-04-23 1161ft  Goal status: MET no limping  4.  Pt will be able to tolerate 5 hours of work at [employer] to be able ot return Baseline:  09-04-23  not  working but returns 10-09-23 Goal status: PROGRESSING  LONG TERM GOALS: Target date: 10-16-23  Pt will be independent with advanced HEP.  Baseline: needs reinforcement Goal status: INITIAL  2.   Pt will be able to negotiate steps without exacerbation of pain greater than 3/10 Baseline: Pt unable to negotiate steps 10/10 Goal status: INITIAL  3.  Pt will be able to perform household chores for at least 20 min without break  Baseline: Pt unable to stand for 15 minutes or stand in the shower for greater than 5 minutes Goal status: INITIAL  4.  Pt would be able to stand for 30 minutes without a break in order to return to modified work standiing Baseline: unable to stand greater than 15 minutes Goal status: INITIAL  5.  Pt will improve LEFS score by at least 12 pts to show improved functional mobility Baseline: 32/80 Goal status: INITIAL  6.  Pt will be able to perform standing SL heel raise on LT  15/25 x Baseline: LT 0/25 Goal status: INITIAL   PLAN:  PT FREQUENCY: 1-2x/week  PT DURATION: 8 weeks  PLANNED INTERVENTIONS: 97164- PT Re-evaluation, 97750- Physical Performance Testing, 97110-Therapeutic exercises, 97530- Therapeutic activity, [id number]- Neuromuscular re-education, 97535- Self Care, 02859- Manual therapy, [id number]- Gait training, [phone]- Electrical stimulation (manual), and 20560 (1-2 muscles), 20561 (3+ muscles)- Dry Needling  PLAN FOR NEXT SESSION: manual  progress loading for left achilles tendonitis and post surgical pain   ***

## 2023-09-10 NOTE — Telephone Encounter (Signed)
 pt lft mess i called her bck and she want to try and go back 09/20/23. she will call job and see if she can do 5hrs and work at [employer] to avoid walking/standing and do breaks every 1.5 hours per 5 hr shift to get off foot. I will fwd to Dr.McDonald

## 2023-09-11 ENCOUNTER — Telehealth: Payer: Self-pay | Admitting: Podiatry

## 2023-09-11 ENCOUNTER — Ambulatory Visit: Admitting: Physical Therapy

## 2023-09-11 NOTE — Telephone Encounter (Signed)
 Patient called in to say her job needs her note to have no restrictions when she able to go back to work.

## 2023-09-11 NOTE — Telephone Encounter (Signed)
 cld pt bk and she said that her job as indicated she can't come back until she can come back with no restrictions. So her RTW will remain 10/09/23

## 2023-09-14 ENCOUNTER — Ambulatory Visit: Admitting: Physical Therapy

## 2023-09-14 ENCOUNTER — Encounter: Payer: Self-pay | Admitting: Physical Therapy

## 2023-09-14 DIAGNOSIS — M6281 Muscle weakness (generalized): Secondary | ICD-10-CM

## 2023-09-14 DIAGNOSIS — M25572 Pain in left ankle and joints of left foot: Secondary | ICD-10-CM | POA: Diagnosis not present

## 2023-09-14 DIAGNOSIS — R262 Difficulty in walking, not elsewhere classified: Secondary | ICD-10-CM

## 2023-09-14 NOTE — Therapy (Signed)
 OUTPATIENT PHYSICAL THERAPY DAILY NOTE   Patient Name: Sara Erickson MRN: [id number] DOB:23-Jul-1975, 48 y.o., female Today's Date: 09/14/2023  END OF SESSION:  PT End of Session - 09/14/23 0843     Visit Number 6    Number of Visits 16    Date for PT Re-Evaluation 10/16/23    Authorization Type Aet[employer]    PT Start Time 0845    PT Stop Time 0926    PT Time Calculation (min) 41 min    Activity Tolerance Patient tolerated treatment well    Behavior During Therapy [facility] for tasks assessed/performed           Past Medical History:  Diagnosis Date   Anemia    Hypertension    Murmur 09/17/2014   Obesity (BMI 30.0-34.9) 09/17/2014   Past Surgical History:  Procedure Laterality Date   HYSTEROSCOPY WITH NOVASURE N/A 01/20/2022   Procedure: DIAGNOSTIC HYSTEROSCOPY WITH POLYPECTOMY;  NOVASURE ENDOMETRIAL ABLATION;  Surgeon: Rutherford Gain, MD;  Location: [facility];  Service: Gynecology;  Laterality: N/A;   LUMBAR DISC SURGERY  2017   Patient Active Problem List   Diagnosis Date Noted   Prediabetes 09/04/2022   IDA (iron deficiency anemia) 01/11/2022   Essential hypertension 09/17/2014   Murmur 09/17/2014   Obesity (BMI 30.0-34.9) 09/17/2014    PCP: Emilio Joesph DEL, PA-C   REFERRING PROVIDER: Silva Juliene SAUNDERS, DPM   REFERRING DIAG:  M72.2 (ICD-10-CM) - Plantar fasciitis, bilateral  M62.462 (ICD-10-CM) - Gastrocnemius equinus of left lower extremity    THERAPY DIAG:  Pain in left ankle and joints of left foot  Muscle weakness (generalized)  Difficulty in walking, not elsewhere classified  Rationale for Evaluation and Treatment: Rehabilitation  ONSET DATE: 05-11-23 surgery and returned to work June 2025 for 4 hours a day  SUBJECTIVE:   SUBJECTIVE STATEMENT: Pt reports that she having almost no pain during the day.  She continues to have pain at night which she feels is not improving.    PERTINENT HISTORY: Heart Murmur, HTN, obesity, lumbar  back surgery 2017  PAIN:  Are you having pain? Yes: NPRS scale: at rest 0/10 sitting but at worst at night 8/10  Pain location: Left foot plantar fascia, and mid Achilles Tendon on Left Pain description: during the day fine with shoes. At night stiff and tight,  Aggravating factors: Standing for longer than 5 minutes. Can't stand in the shower, Getting to sleep is difficulty due stiffness. Unable to work at [employer]. Relieving factors: Gabapentin  and Ibuprofen   PRECAUTIONS: None  RED FLAGS: None   WEIGHT BEARING RESTRICTIONS: WBAT L from surgery  Now in regular shoes and no restrictions  FALLS:  Has patient fallen in last 6 months? No  LIVING ENVIRONMENT: Lives with: lives with their spouse and lives with their daughter Lives in: House/apartment Stairs: stairs in home and difficulty ascending/descending Has following equipment at home: None  OCCUPATION: work at [employer] standing most of time   PLOF: Independent  PATIENT GOALS: Get back to work at [employer] and be able to stand for 8 hours and at least  4 hours to start  NEXT MD VISIT: 09-03-23 Dr Silva  possible cortisone injection  OBJECTIVE:  Note: Objective measures were completed at Evaluation unless otherwise noted.  DIAGNOSTIC FINDINGS: See medical record  PATIENT SURVEYS:  LEFS  Extreme difficulty/unable (0), Quite a bit of difficulty (1), Moderate difficulty (2), Little difficulty (3), No difficulty (4) Survey date:   08-21-23  Any of your usual  work, housework or school activities       1  2. Usual hobbies, recreational or sporting activities        1  3. Getting into/out of the bath        1  4. Walking between rooms        2  5. Putting on socks/shoes        2  6. Squatting          3  7. Lifting an object, like a bag of groceries from the floor         4  8. Performing light activities around your home         1  9. Performing heavy activities around your home         1  10. Getting into/out of a car          4   11. Walking 2 blocks          0  12. Walking 1 mile         0  13. Going up/down 10 stairs (1 flight)         3  14. Standing for 1 hour         2  15.  sitting for 1 hour        4  16. Running on even ground        0  17. Running on uneven ground         0  18. Making sharp turns while running fast         0  19. Hopping          0  20. Rolling over in bed          3  Score total:     32/80     COGNITION: Overall cognitive status: Within functional limits for tasks assessed     SENSATION: WFL Sharp pain developed over mid Achilles tendon  EDEMA:  NT  MUSCLE LENGTH: Hamstrings: Right bil tightness   POSTURE: rounded shoulders, forward head, flexed trunk , and obesity  PALPATION: TTP over mid achilles tendon and mid foot plantar fascia of L LE  LOWER EXTREMITY ROM:  Active ROM Right eval Left eval  Hip flexion    Hip extension    Hip abduction    Hip adduction    Hip internal rotation    Hip external rotation    Knee flexion 120 120  Knee extension 0 0  Ankle dorsiflexion 12 12  Ankle plantarflexion 55 40  Ankle inversion 32 29  Ankle eversion 25 22  Key: WFL = within functional limits not formally assessed, * = concordant pain, s = stiffness/stretching sensation, NT = not tested)    LOWER EXTREMITY MMT:  MMT Right eval Left eval  Hip flexion    Hip extension    Hip abduction    Hip adduction    Hip internal rotation    Hip external rotation    Knee flexion    Knee extension    Ankle dorsiflexion    Ankle plantarflexion 21/25* 0/25  Ankle inversion    Ankle eversion    (Blank rows = not tested, score listed is out of 5 possible points.  N = WNL, D = diminished, C = clear for gross weakness with myotome testing, * = concordant pain with testing)  (Blank rows = not tested)   FUNCTIONAL TESTS:  5 times sit to  stand: 10.40 sec 6 minute walk test: 734.5 ft with two breaks due to pain  Squat must bring  bil  heels /arches off floor and use UE  strength  SL stance 0 sec on Left  SL stand on Right 19 sec GAIT: Distance walked:   734.5 ft  with 2 breaks Assistive device utilized: None Level of assistance: Complete Independence Comments: Pt with antalgic gait left and needing breaks due to pain while standing                                                                                                                                TREATMENT DATE:  [facility] Adult PT Treatment:                                                DATE:  09/14/2023 Therapeutic Exercise: Heel slide in sitting for DF with slider - 2x10 Cybex leg press - 20# bil x10 - SL 2x5 Knee ext machine - 10# bil  Knee flexion machine - 25# bil KT tape Seated heel raises - 2x10  Therapeutic Activity  6 MWT  Manual Therapy: STW L Gastroc/medial, medial>lat   HOME EXERCISE PROGRAM:  Access Code: [email or web address] Previous Access Code: [email or web address]  URL: [email or web address] Date: 08/21/2023 Prepared by: Graydon Dingwall  Exercises - seated heel raise with weights  - 1 x daily - 7 x weekly - 5 sets - 10 reps - Seated Ankle Inversion with Anchored Resistance  - 1 x daily - 7 x weekly - 3 sets - 10 reps - Seated Ankle Eversion with Anchored Resistance  - 1 x daily - 7 x weekly - 3 sets - 10 reps - Seated Ankle Plantar Flexion with Resistance Loop  - 1 x daily - 7 x weekly - 3 sets - 10 reps - Seated Ankle Dorsiflexion with Resistance  - 1 x daily - 7 x weekly - 3 sets - 10 reps  ASSESSMENT:  CLINICAL IMPRESSION:  09/14/2023:  Pt enters clinic reporting that she has not been having any pain during the day but continues to have bothersome sxs at night.  Shows improvement on 6 MWT.  Added in some more focused knee and L LE strengthening today.  Provided info on KT tape as she feels this is helpful.  Again discussed the benefits of progressive loading and short term discomfort leading to long term improvement in pain.  EVAL: Patient is a 48 y.o. female who  was seen today for physical therapy evaluation and treatment for post surgery for Left EPF and endoscopic gastrocnemius recession 05-11-23 by Dr Silva and  was discharged and is now returning due to not being able to tolerate standing at work longer than 15 minutes. Pt with pain 5/10 to 10/10. LEFS 32/80. Pt with impairment in AROM,  muscle weakness, difficulty with walking and inability to maintain single limb status on left LE or perform household chores.  Pt presently unable to work at [employer] in job which requires 5-8 hours of standing. Pt will benefit from skilled PT tin order to return and continue performing job at [employer]  OBJECTIVE IMPAIRMENTS: decreased activity tolerance, decreased knowledge of condition, decreased mobility, difficulty walking, decreased ROM, decreased strength, increased fascial restrictions, postural dysfunction, obesity, and pain.   ACTIVITY LIMITATIONS: carrying, standing, squatting, sleeping, stairs, transfers, and locomotion level  PARTICIPATION LIMITATIONS: meal prep, cleaning, laundry, shopping, community activity, and occupation  PERSONAL FACTORS: Heart Murmur, HTN, obesity, lumbar back surgery 2017  are also affecting patient's functional outcome.   REHAB POTENTIAL: Good  CLINICAL DECISION MAKING: Evolving/moderate complexity  EVALUATION COMPLEXITY: Moderate   GOALS: Goals reviewed with patient? Yes  SHORT TERM GOALS: Target date: 09-18-23 Pt will be I with initial HEP Baseline: Goal status:ONGOING  2.  Pain decreased in Left LE at night to sleep by 25 % Baseline: 8/10 at eval 09-04-23  no pain yesterday and today but still tossing and turning at night Goal status:PROGRESSING  3.  Pt will be able to ambulate without Antalgic gait Baseline: Pt with antalgic gait in normal shoes, 734.5 ft 09-04-23 1144ft  8/8: 6 MWT: 1208 Goal status: MET no limping  4.  Pt will be able to tolerate 5 hours of work at [employer] to be able ot return Baseline:   09-04-23  not working but returns 10-09-23 Goal status: PROGRESSING  LONG TERM GOALS: Target date: 10-16-23  Pt will be independent with advanced HEP.  Baseline: needs reinforcement Goal status: INITIAL  2.   Pt will be able to negotiate steps without exacerbation of pain greater than 3/10 Baseline: Pt unable to negotiate steps 10/10 Goal status: INITIAL  3.  Pt will be able to perform household chores for at least 20 min without break  Baseline: Pt unable to stand for 15 minutes or stand in the shower for greater than 5 minutes Goal status: INITIAL  4.  Pt would be able to stand for 30 minutes without a break in order to return to modified work standiing Baseline: unable to stand greater than 15 minutes Goal status: INITIAL  5.  Pt will improve LEFS score by at least 12 pts to show improved functional mobility Baseline: 32/80 Goal status: INITIAL  6.  Pt will be able to perform standing SL heel raise on LT  15/25 x Baseline: LT 0/25 Goal status: INITIAL   PLAN:  PT FREQUENCY: 1-2x/week  PT DURATION: 8 weeks  PLANNED INTERVENTIONS: 97164- PT Re-evaluation, 97750- Physical Performance Testing, 97110-Therapeutic exercises, 97530- Therapeutic activity, [id number]- Neuromuscular re-education, 97535- Self Care, 02859- Manual therapy, [id number]- Gait training, [phone]- Electrical stimulation (manual), and 20560 (1-2 muscles), 20561 (3+ muscles)- Dry Needling  PLAN FOR NEXT SESSION: manual  progress loading for left achilles tendonitis and post surgical pain   Helene BRAVO Jovon Winterhalter PT 09/14/23 9:34 AM Phone: [phone] Fax: [phone]

## 2023-09-18 ENCOUNTER — Ambulatory Visit: Admitting: Physical Therapy

## 2023-09-18 ENCOUNTER — Encounter: Payer: Self-pay | Admitting: Physical Therapy

## 2023-09-18 DIAGNOSIS — M25572 Pain in left ankle and joints of left foot: Secondary | ICD-10-CM

## 2023-09-18 DIAGNOSIS — M6281 Muscle weakness (generalized): Secondary | ICD-10-CM

## 2023-09-18 DIAGNOSIS — R262 Difficulty in walking, not elsewhere classified: Secondary | ICD-10-CM

## 2023-09-18 NOTE — Therapy (Signed)
 OUTPATIENT PHYSICAL THERAPY DAILY NOTE   Patient Name: Sara Erickson MRN: [id number] DOB:[birth year], 48 y.o., female Today's Date: 09/18/2023  END OF SESSION:  PT End of Session - 09/18/23 0846     Visit Number 7    Number of Visits 16    Date for PT Re-Evaluation 10/16/23    Authorization Type [employer]et[employer]    PT Start Time 0845    PT Stop Time 0926    PT Time Calculation (min) 41 min    Activity Tolerance Patient tolerated treatment well    Behavior During Therapy [facility] for tasks assessed/performed           Past Medical History:  Diagnosis Date   Anemia    Hypertension    Murmur 09/17/2014   Obesity (BMI 30.0-34.9) 09/17/2014   Past Surgical History:  Procedure Laterality Date   HYSTEROSCOPY WITH NOVASURE N/A 01/20/2022   Procedure: DIAGNOSTIC HYSTEROSCOPY WITH POLYPECTOMY;  NOVASURE ENDOMETRIAL ABLATION;  Surgeon: Rutherford Gain, MD;  Location: [facility];  Service: Gynecology;  Laterality: N/A;   LUMBAR DISC SURGERY  2017   Patient Active Problem List   Diagnosis Date Noted   Prediabetes 09/04/2022   IDA (iron deficiency anemia) 01/11/2022   Essential hypertension 09/17/2014   Murmur 09/17/2014   Obesity (BMI 30.0-34.9) 09/17/2014    PCP: Emilio Joesph DEL, PA-C   REFERRING PROVIDER: Silva Juliene SAUNDERS, DPM   REFERRING DIAG:  M72.2 (ICD-10-CM) - Plantar fasciitis, bilateral  M62.462 (ICD-10-CM) - Gastrocnemius equinus of left lower extremity    THERAPY DIAG:  Pain in left ankle and joints of left foot  Muscle weakness (generalized)  Difficulty in walking, not elsewhere classified  Rationale for Evaluation and Treatment: Rehabilitation  ONSET DATE: 05-11-23 surgery and returned to work June 2025 for 4 hours a day  SUBJECTIVE:   SUBJECTIVE STATEMENT: Pt reports that G/S pain is doing relatively well.  She is now really only having pain at night.  She has been trying to walk more on a track and made it 1.5 laps.  PERTINENT  HISTORY: Heart Murmur, HTN, obesity, lumbar back surgery 2017  PAIN:  Are you having pain? Yes: NPRS scale: at rest 0/10 sitting but at worst at night 8/10  Pain location: Left foot plantar fascia, and mid Achilles Tendon on Left Pain description: during the day fine with shoes. At night stiff and tight,  Aggravating factors: Standing for longer than 5 minutes. Can't stand in the shower, Getting to sleep is difficulty due stiffness. Unable to work at [employer]. Relieving factors: Gabapentin  and Ibuprofen   PRECAUTIONS: None  RED FLAGS: None   WEIGHT BEARING RESTRICTIONS: WBAT L from surgery  Now in regular shoes and no restrictions  FALLS:  Has patient fallen in last 6 months? No  LIVING ENVIRONMENT: Lives with: lives with their spouse and lives with their daughter Lives in: House/apartment Stairs: stairs in home and difficulty ascending/descending Has following equipment at home: None  OCCUPATION: work at [employer] standing most of time   PLOF: Independent  PATIENT GOALS: Get back to work at [employer] and be able to stand for 8 hours and at least  4 hours to start  NEXT MD VISIT: 09-03-23 Dr Silva  possible cortisone injection  OBJECTIVE:  Note: Objective measures were completed at Evaluation unless otherwise noted.  DIAGNOSTIC FINDINGS: See medical record  PATIENT SURVEYS:  LEFS  Extreme difficulty/unable (0), Quite a bit of difficulty (1), Moderate difficulty (2), Little difficulty (3), No difficulty (4) Survey date:  08-21-23  Any of your usual work, housework or school activities       1  2. Usual hobbies, recreational or sporting activities        1  3. Getting into/out of the bath        1  4. Walking between rooms        2  5. Putting on socks/shoes        2  6. Squatting          3  7. Lifting an object, like a bag of groceries from the floor         4  8. Performing light activities around your home         1  9. Performing heavy activities around your home          1  10. Getting into/out of a car          4  11. Walking 2 blocks          0  12. Walking 1 mile         0  13. Going up/down 10 stairs (1 flight)         3  14. Standing for 1 hour         2  15.  sitting for 1 hour        4  16. Running on even ground        0  17. Running on uneven ground         0  18. Making sharp turns while running fast         0  19. Hopping          0  20. Rolling over in bed          3  Score total:     32/80     COGNITION: Overall cognitive status: Within functional limits for tasks assessed     SENSATION: WFL Sharp pain developed over mid Achilles tendon  EDEMA:  NT  MUSCLE LENGTH: Hamstrings: Right bil tightness   POSTURE: rounded shoulders, forward head, flexed trunk , and obesity  PALPATION: TTP over mid achilles tendon and mid foot plantar fascia of L LE  LOWER EXTREMITY ROM:  Active ROM Right eval Left eval  Hip flexion    Hip extension    Hip abduction    Hip adduction    Hip internal rotation    Hip external rotation    Knee flexion 120 120  Knee extension 0 0  Ankle dorsiflexion 12 12  Ankle plantarflexion 55 40  Ankle inversion 32 29  Ankle eversion 25 22  Key: WFL = within functional limits not formally assessed, * = concordant pain, s = stiffness/stretching sensation, NT = not tested)    LOWER EXTREMITY MMT:  MMT Right eval Left eval  Hip flexion    Hip extension    Hip abduction    Hip adduction    Hip internal rotation    Hip external rotation    Knee flexion    Knee extension    Ankle dorsiflexion    Ankle plantarflexion 21/25* 0/25  Ankle inversion    Ankle eversion    (Blank rows = not tested, score listed is out of 5 possible points.  N = WNL, D = diminished, C = clear for gross weakness with myotome testing, * = concordant pain with testing)  (Blank rows = not tested)   FUNCTIONAL  TESTS:  5 times sit to stand: 10.40 sec 6 minute walk test: 734.5 ft with two breaks due to pain  Squat must bring   bil  heels /arches off floor and use UE strength  SL stance 0 sec on Left  SL stand on Right 19 sec GAIT: Distance walked:   734.5 ft  with 2 breaks Assistive device utilized: None Level of assistance: Complete Independence Comments: Pt with antalgic gait left and needing breaks due to pain while standing                                                                                                                                TREATMENT DATE:  [facility] Adult PT Treatment:                                                DATE:  09/18/2023 Therapeutic Exercise: Cybex leg press - 60# bil x10 - SL 2x10 Heel raises - bil - 20# - no tolerated Standing heel raise eith L w/s Standing hip abd machine - 12.5# - 3x7 ea with slight heel hang KT tape Seated heel raises - 2x10 SLR   HOME EXERCISE PROGRAM:  Access Code: [email or web address] Previous Access Code: [email or web address]  URL: [email or web address] Date: 08/21/2023 Prepared by: Graydon Dingwall  Exercises - seated heel raise with weights  - 1 x daily - 7 x weekly - 5 sets - 10 reps - Seated Ankle Inversion with Anchored Resistance  - 1 x daily - 7 x weekly - 3 sets - 10 reps - Seated Ankle Eversion with Anchored Resistance  - 1 x daily - 7 x weekly - 3 sets - 10 reps - Seated Ankle Plantar Flexion with Resistance Loop  - 1 x daily - 7 x weekly - 3 sets - 10 reps - Seated Ankle Dorsiflexion with Resistance  - 1 x daily - 7 x weekly - 3 sets - 10 reps  ASSESSMENT:  CLINICAL IMPRESSION:  09/18/2023:  Pt enter clinic reporting improved G/S sxs but now reports PF pain that is most bothersome.  Also having some L knee pain.  Continued to reinforce the need to find way to progressively load G/S complex and PF.  Encouraged her to keep tack of how far she is walking on track and try to slowly increase volume.  Continue with isometric heel raises on L with as much weight as is tolerated.  EVAL: Patient is a 48 y.o. female who was seen today for  physical therapy evaluation and treatment for post surgery for Left EPF and endoscopic gastrocnemius recession 05-11-23 by Dr Silva and  was discharged and is now returning due to not being able to tolerate standing at work longer than 15 minutes. Pt with pain 5/10 to 10/10. LEFS 32/80. Pt with impairment in  AROM, muscle weakness, difficulty with walking and inability to maintain single limb status on left LE or perform household chores.  Pt presently unable to work at [employer] in job which requires 5-8 hours of standing. Pt will benefit from skilled PT tin order to return and continue performing job at [employer]  OBJECTIVE IMPAIRMENTS: decreased activity tolerance, decreased knowledge of condition, decreased mobility, difficulty walking, decreased ROM, decreased strength, increased fascial restrictions, postural dysfunction, obesity, and pain.   ACTIVITY LIMITATIONS: carrying, standing, squatting, sleeping, stairs, transfers, and locomotion level  PARTICIPATION LIMITATIONS: meal prep, cleaning, laundry, shopping, community activity, and occupation  PERSONAL FACTORS: Heart Murmur, HTN, obesity, lumbar back surgery 2017  are also affecting patient's functional outcome.   REHAB POTENTIAL: Good  CLINICAL DECISION MAKING: Evolving/moderate complexity  EVALUATION COMPLEXITY: Moderate   GOALS: Goals reviewed with patient? Yes  SHORT TERM GOALS: Target date: 09-18-23 Pt will be I with initial HEP Baseline: Goal status:ONGOING  2.  Pain decreased in Left LE at night to sleep by 25 % Baseline: 8/10 at eval 09-04-23  no pain yesterday and today but still tossing and turning at night Goal status:PROGRESSING  3.  Pt will be able to ambulate without Antalgic gait Baseline: Pt with antalgic gait in normal shoes, 734.5 ft 09-04-23 1170ft  8/8: 6 MWT: 1208 Goal status: MET no limping  4.  Pt will be able to tolerate 5 hours of work at [employer] to be able ot return Baseline:  09-04-23  not  working but returns 10-09-23 Goal status: PROGRESSING  LONG TERM GOALS: Target date: 10-16-23  Pt will be independent with advanced HEP.  Baseline: needs reinforcement Goal status: INITIAL  2.   Pt will be able to negotiate steps without exacerbation of pain greater than 3/10 Baseline: Pt unable to negotiate steps 10/10 Goal status: INITIAL  3.  Pt will be able to perform household chores for at least 20 min without break  Baseline: Pt unable to stand for 15 minutes or stand in the shower for greater than 5 minutes Goal status: INITIAL  4.  Pt would be able to stand for 30 minutes without a break in order to return to modified work standiing Baseline: unable to stand greater than 15 minutes Goal status: INITIAL  5.  Pt will improve LEFS score by at least 12 pts to show improved functional mobility Baseline: 32/80 Goal status: INITIAL  6.  Pt will be able to perform standing SL heel raise on LT  15/25 x Baseline: LT 0/25 Goal status: INITIAL   PLAN:  PT FREQUENCY: 1-2x/week  PT DURATION: 8 weeks  PLANNED INTERVENTIONS: 97164- PT Re-evaluation, 97750- Physical Performance Testing, 97110-Therapeutic exercises, 97530- Therapeutic activity, [id number]- Neuromuscular re-education, 97535- Self Care, 02859- Manual therapy, [id number]- Gait training, [phone]- Electrical stimulation (manual), and 20560 (1-2 muscles), 20561 (3+ muscles)- Dry Needling  PLAN FOR NEXT SESSION: manual  progress loading for left achilles tendonitis and post surgical pain   Helene BRAVO Aiana Nordquist PT 09/18/23 10:10 AM Phone: [phone] Fax: [phone]

## 2023-09-21 ENCOUNTER — Ambulatory Visit: Admitting: Physical Therapy

## 2023-09-28 ENCOUNTER — Ambulatory Visit: Payer: Self-pay | Admitting: Physical Therapy

## 2023-09-28 ENCOUNTER — Telehealth: Payer: Self-pay | Admitting: Physical Therapy

## 2023-09-28 NOTE — Telephone Encounter (Signed)
 Called and informed patient of missed visit and provided reminder of next appt and attendance policy.  Pt reports unexpected death in family and plans to attend next appt.

## 2023-10-01 ENCOUNTER — Telehealth: Payer: Self-pay | Admitting: Physical Therapy

## 2023-10-01 ENCOUNTER — Ambulatory Visit: Payer: Self-pay | Admitting: Physical Therapy

## 2023-10-01 NOTE — Telephone Encounter (Signed)
 Called and informed patient of missed visit via VM.  Pt with 2 late cancels and 1 N/S in last 3 visits.  Advised that last appt would be canceled.  If she wishes to continue PT she can call back and will be allowed to schedule 1 visit at a time.

## 2023-10-03 ENCOUNTER — Encounter: Payer: Self-pay | Admitting: Physical Therapy

## 2023-10-04 ENCOUNTER — Encounter: Admitting: Podiatry

## 2023-10-04 ENCOUNTER — Encounter: Payer: Self-pay | Admitting: Podiatry

## 2023-10-04 ENCOUNTER — Ambulatory Visit: Admitting: Podiatry

## 2023-10-04 VITALS — Ht 69.0 in | Wt 221.0 lb

## 2023-10-04 DIAGNOSIS — M62462 Contracture of muscle, left lower leg: Secondary | ICD-10-CM

## 2023-10-04 DIAGNOSIS — M722 Plantar fascial fibromatosis: Secondary | ICD-10-CM | POA: Diagnosis not present

## 2023-10-05 DIAGNOSIS — Z0271 Encounter for disability determination: Secondary | ICD-10-CM

## 2023-10-05 NOTE — Telephone Encounter (Signed)
 See notes.

## 2023-10-07 NOTE — Progress Notes (Signed)
  Subjective:  Patient ID: Sara Erickson, female    DOB: [birth year],  MRN: [id number]  Chief Complaint  Patient presents with   Post-op Follow-up    Return in about 1 month (around 09/01/2023) for post op (no x-rays). Patient states intermittent pain in the calf area. Patient in requesting the completion of FMLA or [employer] paperwork completed.    DOS: 05/11/2023 Procedure: Left EPF and endoscopic gastrocnemius recession  48 y.o. female returns for post-op check.   Doing much better now that she had therapy  Review of Systems: Negative except as noted in the HPI. Denies N/V/F/Ch.   Objective:  There were no vitals filed for this visit. Body mass index is 32.64 kg/m. Constitutional Well developed. Well nourished.  Vascular Foot warm and well perfused. Capillary refill normal to all digits.  Calf is soft and supple, no posterior calf or knee pain, negative Homans' sign  Neurologic Normal speech. Oriented to person, place, and time. Epicritic sensation to light touch grossly present bilaterally.  Dermatologic incision is well-healed without hypertrophy  Orthopedic: 4+ out of 5 strength.  Slight tenderness in distal Achilles midsubstance, none at the posterior insertion, the gastrocnemius recession site or in the plantar heel    Assessment:   1. Plantar fasciitis, bilateral   2. Gastrocnemius equinus of left lower extremity     Plan:  Patient was evaluated and treated and all questions answered.  S/p foot surgery left improved quite a bit.  She is ready to return to work.  She may return to work on Monday therapy.  I will see her back. Return in about 2 months (around 12/04/2023) for f/u plantar fascia surgery.

## 2023-10-09 NOTE — Telephone Encounter (Signed)
 s/w pt and she said [employer] needed medical records. I adv her she would have to call MR to get those. I can fax office notes from June-present. She said to fax office notes. faxed to [phone]

## 2023-12-04 ENCOUNTER — Ambulatory Visit: Admitting: Podiatry

## 2024-01-15 NOTE — Progress Notes (Signed)
 Attempted to contact the patient to discuss upcoming appointments and recommended health maintenance screenings for the year.  A [facility] Specialist has reviewed the patient's chart to identify any gaps in care and outstanding preventive health needs. However, we were unable to reach the patient to review or schedule the necessary follow-up appointments and screenings.  NA  Left message: yes   MyChart message sent: yes   Voicemail was left including callback details and our hours of operation.  Comments:
